# Patient Record
Sex: Male | Born: 1985 | Hispanic: Yes | Marital: Single | Smoking: Never smoker
Health system: Southern US, Community
[De-identification: ages and names within clinical notes are randomized; demographics above are authoritative.]

## PROBLEM LIST (undated history)

## (undated) DIAGNOSIS — T7840XA Allergy, unspecified, initial encounter: Secondary | ICD-10-CM

## (undated) HISTORY — DX: Allergy, unspecified, initial encounter: T78.40XA

---

## 2011-10-09 ENCOUNTER — Ambulatory Visit: Payer: Self-pay | Admitting: Family Medicine

## 2011-10-09 DIAGNOSIS — J4550 Severe persistent asthma, uncomplicated: Secondary | ICD-10-CM | POA: Insufficient documentation

## 2011-10-09 DIAGNOSIS — J45909 Unspecified asthma, uncomplicated: Secondary | ICD-10-CM

## 2011-10-09 MED ORDER — ALBUTEROL SULFATE HFA 108 (90 BASE) MCG/ACT IN AERS: 2.0000 | INHALATION_SPRAY | Freq: Four times a day (QID) | RESPIRATORY_TRACT | Status: DC | PRN

## 2011-10-09 MED ORDER — PREDNISONE 20 MG PO TABS: 40.0000 mg | ORAL_TABLET | Freq: Every day | ORAL | Status: AC

## 2011-10-09 NOTE — Patient Instructions (Signed)
Asma en el adulto (Asthma, Adult) El asma es causada por la contraccin del ancho de los conductos de aire en los pulmones. Puede desencadenarse debido al polen, el polvo, la piel o plumas de animales, el moho, algunos alimentos, las dificultades respiratorias, la exposicin al humo, el ejercicio, el estrs emocional u otros alrgenos (elementos que provocan reacciones alrgicas). La repeticin de los ataques es comn. INSTRUCCIONES PARA EL CUIDADO DOMICILIARIO  Tome los medicamentos recetados que le indic el profesional que lo asiste.   Evite el polen, el polvo, la piel o plumas de animales, el moho, el humo y otras cosas que puedan provocarle un ataque en el hogar o en el trabajo.   Puede que tenga menos ataques si disminuye el polvo en el hogar. Los purificadores de aire electrostticos pueden ayudar.   Puede ser de utilidad cubrir las almohadas o colchones con materiales que no tiendan a provocar alergias.   Converse con el profesional que lo asiste acerca del plan de accin que deber implementar para el manejo en el hogar de los ataques de asma que incluya un medidor de flujo mximo para medir la gravedad del ataque. Un plan de accin que lo ayude a minimizar o detener los ataques sin necesidad de solicitar atencin mdica.   Si no tiene restriccin para consumir lquidos, beba entre 8 y 10 vasos de agua por da.   Siempre tenga preparado un plan par solicitar atencin mdica si es necesario, y que incluya el llamado al mdico, el acceso a un centro de emergencias local y el llamado al 911 para los casos graves.   Converse con el profesional que lo asiste sobre posibles rutinas de ejercicio fsico que pudiera practicar.   Si las plumas o el pelo de un animal son el motivo del asma, deber deshacerse de las mascotas.  SOLICITE ATENCIN MDICA SI:  Respira con dificultad an cuando toma la medicina para prevenir los ataques.   Siente dolores musculares, dolor en el pecho o espesamiento  del esputo.   Su esputo cambia de un color claro o blanco a un color amarillo, verde, gris o sanguinolento.   Tiene trastornos ocasionados por la medicina que est tomando (como urticaria, picazn, hinchazn o dificultades respiratorias).  SOLICITE ATENCIN MDICA DE INMEDIATO SI:  Su medicamento habitual no detiene el resuello o si le aumenta la tos o le falta el aliento.   Tiene una creciente dificultad para respirar.   Tiene fiebre.  EST SEGURO QUE:  Comprende las instrucciones para el alta mdica.   Controlar su enfermedad.   Solicitar atencin mdica de inmediato segn las indicaciones.  Document Released: 08/22/2005 Document Revised: 05/04/2011 [employer] Patient Information 2012 [employer]. 

## 2011-10-09 NOTE — Progress Notes (Signed)
26 yo man from [place] with chronic asthma needing refill on Rx  Very SOB lately.  O:  NAD  Heent clear Chest  Decreased BS with wheezes Color good No resp distress

## 2011-12-16 ENCOUNTER — Ambulatory Visit: Payer: Self-pay | Admitting: Internal Medicine

## 2011-12-16 ENCOUNTER — Encounter: Payer: Self-pay | Admitting: Internal Medicine

## 2011-12-16 VITALS — BP 117/82 | HR 96 | Temp 98.4°F | Resp 16 | Ht 67.75 in | Wt 222.0 lb

## 2011-12-16 DIAGNOSIS — Z789 Other specified health status: Secondary | ICD-10-CM

## 2011-12-16 DIAGNOSIS — R0602 Shortness of breath: Secondary | ICD-10-CM

## 2011-12-16 DIAGNOSIS — R0609 Other forms of dyspnea: Secondary | ICD-10-CM

## 2011-12-16 DIAGNOSIS — T7840XA Allergy, unspecified, initial encounter: Secondary | ICD-10-CM | POA: Insufficient documentation

## 2011-12-16 DIAGNOSIS — R0689 Other abnormalities of breathing: Secondary | ICD-10-CM

## 2011-12-16 DIAGNOSIS — J45909 Unspecified asthma, uncomplicated: Secondary | ICD-10-CM

## 2011-12-16 DIAGNOSIS — E669 Obesity, unspecified: Secondary | ICD-10-CM

## 2011-12-16 LAB — GLUCOSE, POCT (MANUAL RESULT ENTRY): POC Glucose: 94

## 2011-12-16 MED ORDER — BECLOMETHASONE DIPROPIONATE 80 MCG/ACT IN AERS: 2.0000 | INHALATION_SPRAY | Freq: Two times a day (BID) | RESPIRATORY_TRACT | Status: DC

## 2011-12-16 MED ORDER — PREDNISONE 20 MG PO TABS: ORAL_TABLET | ORAL | Status: DC

## 2011-12-16 MED ORDER — CETIRIZINE HCL 10 MG PO TABS: 10.0000 mg | ORAL_TABLET | Freq: Every day | ORAL | Status: DC

## 2011-12-16 MED ORDER — ALBUTEROL SULFATE HFA 108 (90 BASE) MCG/ACT IN AERS: 2.0000 | INHALATION_SPRAY | Freq: Four times a day (QID) | RESPIRATORY_TRACT | Status: DC | PRN

## 2011-12-16 MED ORDER — ALBUTEROL SULFATE (2.5 MG/3ML) 0.083% IN NEBU
2.5000 mg | INHALATION_SOLUTION | Freq: Once | RESPIRATORY_TRACT | Status: AC
  Administered 2011-12-16: 2.5 mg via RESPIRATORY_TRACT

## 2011-12-16 MED ORDER — METHYLPREDNISOLONE ACETATE 80 MG/ML IJ SUSP
120.0000 mg | Freq: Once | INTRAMUSCULAR | Status: AC
  Administered 2011-12-16: 120 mg via INTRAMUSCULAR

## 2011-12-16 NOTE — Patient Instructions (Signed)
Rinitis Alrgica (Allergic Rhinitis) La rinitis alrgica aparece cuando las membranas mucosas de la nariz reaccionan a los alrgenos. Los alrgenos son las partculas que estn en el aire y a las que el organismo responde cuando existe una Automotive engineer. Esto hace que usted libere anticuerpos de Programmer, multimedia. A travs de una sucesin de procesos, finalmente se libera histamina (de ah el uso de antihistamnicos) en el torrente sanguneo. Aunque esto implica una proteccin para su organismo, es lo que le produce las Manuel Garcia., como estornudos frecuentes, congestin, picazn y goteos de Architectural technologist.  CAUSAS Los alergenos del polen pueden provenir del csped, rboles y hierbas. Esto produce la rinitis alrgica estacional, o "fiebre de heno". Otras alrgenos pueden ocasionar rinitis alrgica persistente (rinitis alrgica perenne) como aquellos que contienen los caros del polvo del hogar, el pelaje de las mascotas y las esporas del moho.  SNTOMAS  Congestin nasal.   Picazn y goteo de la nariz con estornudos y lagrimeo de los ojos.   Generalmente, tambin puede haber picazn de la boca, ojos y odos.  Las alergias no pueden curarse pero pueden controlarse con medicamentos. DIAGNSTICO Si no reconoce exactamente cul es el alrgeno que le ocasiona el problema, podrn realizarle pruebas de [place], o de piel para determinarlo. TRATAMIENTO  Evite el alrgeno.   Podrn ser tiles medicamentos y vacunas para la alergia (inmunoterapia).   Con frecuencia la fiebre de heno se trata simplemente con antihistamnicos en forma de pldoras o sprays nasales. Los antihistamnicos bloquean los efectos de la histamina. Existen medicamentos de venta libre que lo ayudarn a Associate Professor, la congestin nasal y la hinchazn alrededor de los ojos. Consulte con el profesional antes de tomar o Civil Service fast streamer.  Si estos medicamentos no le Merchant navy officer, existen muchos otros nuevos que el  profesional que lo asiste puede prescribirle. Si las medidas iniciales no son efectivas, podrn utilizarse medicamentos ms fuertes. Las inyecciones desensibilizantes pueden utilizarse si los otros medicamentos fracasan. La desensibilizacin aparece cuando un paciente recibe inyecciones continuas hasta que el cuerpo se vuelve menos sensible al alrgeno. Asegrese de Education officer, environmental un seguimiento con el profesional que lo asiste si los problemas continan. SOLICITE ANTENCIN MDICA SI:   Le sube la temperatura a ms de 100.5 F (38.1 C).   Presenta tos que no se alivia (persistente).   Le falta el aire.   Comienza a respirar con dificultad.   Los sntomas interfieren con las actividades diarias.  Document Released: 06/01/2005 Document Revised: 08/11/2011 [facility] Patient Information 2012 [place], [place].Prevencin del asma (Asthma Prevention) El humo del cigarrillo, el polvo domstico, los hongos, el polen, la descamacin de los [place], algunos insectos, la actividad fsica y [place] aire frio pueden ser disparadores de un episodio de asma. En algunos casos, la causa no se identifica.  Para disminuir la frecuencia de los ataques, siga los siguientes consejos que podr Education officer, environmental en su casa:  Evite el humo del cigarrillo o el que provenga de otras fuentes. No permita que nadie fume en la casa que habita un enfermo de asma. Si se permite fumar en el interior, deber hacerse en una habitacin en la que se cierre la puerta. Posteriormente, deber abrirse una ventana para ventilar el aire. En lo posible, no utilice un horno a lea, una estufa a querosene o un hogar de lea. Minimice la exposicin a toda fuente de humo, inclusive incienso, velas, fogatas o fuegos artificiales.   Disminuya la exposicin al polvo. Mantenga las ventanas cerradas y [place] aire acondicionado  central durante la temporada de alergia al polen. En lo posible, permanezca dentro de la habitacin con las ventanas cerradas desde las  ltimas horas de la maana hasta la tarde. Evite cortar el csped si el polen le causa alergia. Mdese de ropa y tome una ducha despus de estar en el exterior durante esta poca del ao.   Elimine el moho de los baos y zonas hmedas. Hgalo limpiando los pisos con un fungicida o con lavandina diluida. Evite el uso de humidificadores, vaporizadores o refrigerantes hmedos. Pueden diseminar el moho a travs del aire. Arregle las caeras que pierdan agua u otras fuentes de agua que tengan moho alrededor.   Disminuya la exposicin al polvo de la casa. Hgalo dejando los pisos desocupados, aspirndolos con frecuencia y [employer] filtros de las calderas y los acondicionadores de aire con Psychologist, clinical. Evite el uso de elementos de cama de plumas, lana o espuma de goma. Utilice almohadas de Equities trader y cobertores de [employer] colchones. Lave la ropa de cama semanalmente en agua caliente (ms caliente que 130 F).   Trate de pedirle a otra persona que realice el aspirado una o dos veces por semana. Permanezca fuera de las habitaciones mientras son aspiradas y por algn tiempo despus. Si el aspirado lo realiza usted, use una mascarilla para el polvo (consgala en una ferretera), Neomia Dear bolsa para la aspiradora doble o de [place], o una aspiradora con un filtro HEPA.   Evite los perfumes, el talco, el aerosol para el cabello, las pinturas y otros olores y vapores intensos.   Mantenga a las [address] caliente (gatos, perros, roedores, Engineer, mining) fuera de su casa, si ellos le provocan el asma. Si no puede mantener a las Engineer, mining, Engineer, water fuera de la habitacin y de otras reas en las que duerme, y Dietitian la puerta cerrada. Quite de su casa las alfombras y los muebles cubiertos con telas. Si eso no es posible, [address] las mascotas lejos de los muebles cubiertos de tela y de las alfombras.   Elimine las cucarachas. Mantenga los alimentos y las bebidas en contenedores  cerrados. Nunca deje comida a la vista. Use venenos, tramperas polvos, geles o pastas (por ejemplo cido brico). Si [place] un aerosol para exterminar las cucarachas, permanezca fuera de la habitacin hasta que el olor desaparezca.   Disminuya la humedad del ambiente a menos del 60%. Use un purificador de aire.   Evite los sulfitos en alimentos y bebidas. No beba cerveza ni vino, ni consuma frutas secas, patatas procesadas o langostinos, si estos le producen sntomas de asma.   Evite el aire fro. Cbrase la nariz y la boca con una bufanda en [address] fros o ventosos.   No consuma aspirina. Este es el medicamento que con ms frecuencia causa ataques de asma.   Si la actividad fsica le desencadena un ataque de asma, consulte a su mdico como debe prepararse antes de hacer ejercicio. (Por ejemplo, pregunte si puede usar su inhalador 10 minutos antes de la [place]).   Evite el contacto cercano con personas que estn resfriadas o tienen gripe, ya que los sntomas de asma pueden empeorar si se contagia la infeccin. Lvese las manos cuidadosamente despus de tocar objetos manipulados por otras personas que sufren infecciones respiratorias.   Aplquese la vacuna contra la gripe todos los aos para protegerse contra el virus de la gripe, lo que con frecuencia hace que el asma empeore durante das o semanas. Tambin aplquese la vacuna contra la  neumona una vez cada 5  10 aos.  Comunquese con su mdico si necesita ms informacin acerca de las medidas que puede tomar para prevenir los ataques de asma. Document Released: 08/22/2005 Document Revised: 08/11/2011 [facility] Patient Information 2012 [place], [place].

## 2011-12-16 NOTE — Progress Notes (Signed)
  Subjective:    Patient ID: Nicholas Sanford, male    DOB: 02/04/1986, 26 y.o.   MRN: [id number]  HPI Asthma attack, SOB, allergy attack, has these 2 or 3x per year. Only requested albuterol HFA, needs maintenance steroid inhalers. Has no insurance. No fever, and no sighns of infection. Last visit in Feb. And given oral prednisone and albuterol   Review of Systems     Objective:   Physical Exam PFR 180 and nl is 600 Nasal congestion and rhinorrheaand boggy edematous MMs. Lungs wheezing and tight all over  Nebulizer with albuterol  Results for orders placed in visit on 12/16/11  GLUCOSE, POCT (MANUAL RESULT ENTRY)      Component Value Range   POC Glucose 94         Assessment & Plan:  Asthma and Allergys  Qvar qd Albuterol prn Prednisone 16d taper Cetirizine 10mg  qd  RTC1 week  Post peak flow: 280 Pulse Ox at end of visit: 92%

## 2011-12-22 ENCOUNTER — Ambulatory Visit: Payer: Self-pay | Admitting: Internal Medicine

## 2011-12-22 DIAGNOSIS — J309 Allergic rhinitis, unspecified: Secondary | ICD-10-CM

## 2011-12-22 DIAGNOSIS — Z789 Other specified health status: Secondary | ICD-10-CM

## 2011-12-22 DIAGNOSIS — R0602 Shortness of breath: Secondary | ICD-10-CM

## 2011-12-22 NOTE — Progress Notes (Signed)
  Subjective:    Patient ID: Nicholas Sanford, male    DOB: 02/04/1986, 25 y.o.   MRN: [id number]  HPI Improving, less allergy and less SOB. Has moderate to severe asthma   Review of Systems     Objective:   Physical Exam PFR 350, nl610 Lungs wheezy with decreased BS       Assessment & Plan:  ON prednisone taper 10 more days On Qvar and albuterol F/up 10d

## 2012-09-05 HISTORY — PX: APPENDECTOMY: SHX54

## 2012-09-13 ENCOUNTER — Other Ambulatory Visit: Payer: Self-pay | Admitting: Internal Medicine

## 2012-11-04 ENCOUNTER — Ambulatory Visit: Payer: Self-pay | Admitting: Family Medicine

## 2012-11-04 VITALS — BP 138/86 | HR 71 | Temp 98.9°F | Resp 16 | Ht 68.0 in | Wt 228.0 lb

## 2012-11-04 DIAGNOSIS — E669 Obesity, unspecified: Secondary | ICD-10-CM

## 2012-11-04 DIAGNOSIS — J45901 Unspecified asthma with (acute) exacerbation: Secondary | ICD-10-CM

## 2012-11-04 DIAGNOSIS — J45909 Unspecified asthma, uncomplicated: Secondary | ICD-10-CM

## 2012-11-04 DIAGNOSIS — R062 Wheezing: Secondary | ICD-10-CM

## 2012-11-04 DIAGNOSIS — R0689 Other abnormalities of breathing: Secondary | ICD-10-CM

## 2012-11-04 DIAGNOSIS — Z789 Other specified health status: Secondary | ICD-10-CM

## 2012-11-04 DIAGNOSIS — R0602 Shortness of breath: Secondary | ICD-10-CM

## 2012-11-04 MED ORDER — IPRATROPIUM BROMIDE 0.02 % IN SOLN: 0.5000 mg | Freq: Once | RESPIRATORY_TRACT | Status: DC

## 2012-11-04 MED ORDER — METHYLPREDNISOLONE SODIUM SUCC 125 MG IJ SOLR: 125.0000 mg | Freq: Once | INTRAMUSCULAR | Status: DC

## 2012-11-04 MED ORDER — ALBUTEROL SULFATE HFA 108 (90 BASE) MCG/ACT IN AERS: 2.0000 | INHALATION_SPRAY | Freq: Four times a day (QID) | RESPIRATORY_TRACT | Status: DC | PRN

## 2012-11-04 MED ORDER — PREDNISONE 20 MG PO TABS: ORAL_TABLET | ORAL | Status: DC

## 2012-11-04 MED ORDER — ALBUTEROL SULFATE (2.5 MG/3ML) 0.083% IN NEBU: 2.5000 mg | INHALATION_SOLUTION | Freq: Once | RESPIRATORY_TRACT | Status: DC

## 2012-11-04 MED ORDER — BECLOMETHASONE DIPROPIONATE 80 MCG/ACT IN AERS: 1.0000 | INHALATION_SPRAY | RESPIRATORY_TRACT | Status: DC | PRN

## 2012-11-04 NOTE — Progress Notes (Signed)
[facility]:  Office Visit  Chief Complaint:  Chief Complaint  Patient presents with  . Asthma    HPI: Nicholas Sanford is a 27 y.o. male who complains of  Here for asthma sxs ie wheezes x 3 days, ran out of Qvar 3 days ago. He has allergies as well. His attack got triggered after he was in the car with the window down.  No fevers, chills, URI sxs; + minimal SOB/DOE. Denies CP  Past Medical History  Diagnosis Date  . Allergy   . Asthma    History reviewed. No pertinent past surgical history. History   Social History  . Marital Status: Single    Spouse Name: N/A    Number of Children: N/A  . Years of Education: N/A   Social History Main Topics  . Smoking status: Current Every Day Smoker  . Smokeless tobacco: None  . Alcohol Use: No  . Drug Use: No  . Sexually Active: None   Other Topics Concern  . None   Social History Narrative  . None   History reviewed. No pertinent family history. Allergies  Allergen Reactions  . Penicillins Itching   Prior to Admission medications   Medication Sig Start Date End Date Taking? Authorizing Stoney Karczewski  beclomethasone (QVAR) 80 MCG/ACT inhaler Inhale 2 puffs into the lungs 2 (two) times daily. 12/16/11 12/15/12 Yes Jonita Albee, MD  albuterol (PROVENTIL HFA;VENTOLIN HFA) 108 (90 BASE) MCG/ACT inhaler Inhale 2 puffs into the lungs every 6 (six) hours as needed. 10/09/11   Elvina Sidle, MD  albuterol (PROVENTIL HFA;VENTOLIN HFA) 108 (90 BASE) MCG/ACT inhaler Inhale 2 puffs into the lungs every 6 (six) hours as needed for wheezing. 12/16/11 12/15/12  Jonita Albee, MD  cetirizine (ZYRTEC) 10 MG tablet Take 1 tablet (10 mg total) by mouth daily. 12/16/11 12/15/12  Jonita Albee, MD  predniSONE (DELTASONE) 20 MG tablet 1 tid x5d, 1 bid x5d, 1 qd x5d po pc for breathing 12/16/11   Jonita Albee, MD     ROS: The patient denies fevers, chills, night sweats, unintentional weight loss, chest pain, palpitations, nausea, vomiting,  abdominal pain, dysuria, hematuria, melena, numbness, weakness, or tingling.  All other systems have been reviewed and were otherwise negative with the exception of those mentioned in the HPI and as above.    PHYSICAL EXAM: Filed Vitals:   11/04/12 1036  BP: 138/86  Pulse: 71  Temp: 98.9 F (37.2 C)  Resp: 16  Spo 2     97% Filed Vitals:   11/04/12 1036  Height: 5\' 8"  (1.727 m)  Weight: 228 lb (103.42 kg)   Body mass index is 34.68 kg/(m^2).  General: Alert, no acute distress HEENT:  Normocephalic, atraumatic, oropharynx patent. No exudates, TM nl. No increase WOB Cardiovascular:  Regular rate and rhythm, no rubs murmurs or gallops.  No Carotid bruits, radial pulse intact. No pedal edema.  Respiratory: Clear to auscultation bilaterally.  + diffuse wheezes. No rales, or rhonchi.  No cyanosis, no use of accessory musculature GI: No organomegaly, abdomen is soft and non-tender, positive bowel sounds.  No masses. Skin: No rashes. Neurologic: Facial musculature symmetric. Psychiatric: Patient is appropriate throughout our interaction. Lymphatic: No cervical lymphadenopathy Musculoskeletal: Gait intact.   LABS: Results for orders placed in visit on 12/16/11  GLUCOSE, POCT (MANUAL RESULT ENTRY)      Result Value Range   POC Glucose 94       EKG/XRAY:   Primary read interpreted  by Dr. Conley Rolls at [facility].   ASSESSMENT/PLAN: Encounter Diagnoses  Name Primary?  Marland Kitchen Asthma with acute exacerbation Yes  . Wheeze   . Allergy history unknown   . Asthma   . Respiratory insufficiency   . SOB (shortness of breath)   . Obesity    Pre-peak is 200 post 400 after duonebs. His expected should be in 525-550s.  Feels better after breathing treatment. Decrease wheezing and increase lung expansion Ok not to have IM steroids since self pay I will put him on a longer steroid taper since we are not doing IM steroids, rx  prednisone 20 mg x  3 pills 5 D, 2 pills x 5D, then 1pill x 5D.  Refilled  Qvar, Albuterol Advise patient that he needs to take his zyrtec regular and use his Qvar regular since he is prone to getting asthma exacerbations, the Qvar as maintenance will improve his asthma--- 2 puffs BID F/u in 48 hrs prn or go to ER prn for worsening sxs    LE, THAO PHUONG, DO 11/04/2012 11:46 AM

## 2012-11-04 NOTE — Patient Instructions (Addendum)
Prevencin del asma (Asthma Prevention) El humo del cigarrillo, el polvo domstico, los hongos, el polen, la descamacin de los [place], algunos insectos, la actividad fsica y [place] aire frio pueden ser disparadores de un episodio de asma. En algunos casos, la causa no se identifica.  Para disminuir la frecuencia de los ataques, siga los siguientes consejos que podr Education officer, environmental en su casa:  Evite el humo del cigarrillo o el que provenga de otras fuentes. No permita que nadie fume en la casa que habita un enfermo de asma. Si se permite fumar en el interior, deber hacerse en una habitacin en la que se cierre la puerta. Posteriormente, deber abrirse una ventana para ventilar el aire. En lo posible, no utilice un horno a lea, una estufa a querosene o un hogar de lea. Minimice la exposicin a toda fuente de humo, inclusive incienso, velas, fogatas o fuegos artificiales.  Disminuya la exposicin al polvo. Mantenga las ventanas cerradas y [employer] aire acondicionado central durante la temporada de alergia al polen. En lo posible, permanezca dentro de la habitacin con las ventanas cerradas desde las ltimas horas de la maana hasta la tarde. Evite cortar el csped si el polen le causa alergia. Mdese de ropa y tome una ducha despus de estar en el exterior durante esta poca del ao.  Elimine el moho de los baos y zonas hmedas. Hgalo limpiando los pisos con un fungicida o con lavandina diluida. Evite el uso de humidificadores, vaporizadores o refrigerantes hmedos. Pueden diseminar el moho a travs del aire. Arregle las caeras que pierdan agua u otras fuentes de agua que tengan moho alrededor.  Disminuya la exposicin al polvo de la casa. Hgalo dejando los pisos desocupados, aspirndolos con frecuencia y [employer] filtros de las calderas y los acondicionadores de aire con Psychologist, clinical. Evite el uso de elementos de cama de plumas, lana o espuma de goma. Utilice almohadas de Equities trader y  cobertores de [employer] colchones. Lave la ropa de cama semanalmente en agua caliente (ms caliente que 130 F).  Trate de pedirle a otra persona que realice el aspirado una o dos veces por semana. Permanezca fuera de las habitaciones mientras son aspiradas y por algn tiempo despus. Si el aspirado lo realiza usted, use una mascarilla para el polvo (consgala en una ferretera), Neomia Dear bolsa para la aspiradora doble o de [place], o una aspiradora con un filtro HEPA.  Evite los perfumes, el talco, el aerosol para el cabello, las pinturas y otros olores y vapores intensos.  Mantenga a las [address] caliente (gatos, perros, roedores, Engineer, mining) fuera de su casa, si ellos le provocan el asma. Si no puede mantener a las Engineer, mining, Engineer, water fuera de la habitacin y de otras reas en las que duerme, y Dietitian la puerta cerrada. Quite de su casa las alfombras y los muebles cubiertos con telas. Si eso no es posible, [address] las mascotas lejos de los muebles cubiertos de tela y de las alfombras.  Elimine las cucarachas. Mantenga los alimentos y las bebidas en contenedores cerrados. Nunca deje comida a la vista. Use venenos, tramperas polvos, geles o pastas (por ejemplo cido brico). Si [place] un aerosol para exterminar las cucarachas, permanezca fuera de la habitacin hasta que el olor desaparezca.  Disminuya la humedad del ambiente a menos del 60%. Use un purificador de aire.  Evite los sulfitos en alimentos y bebidas. No beba cerveza ni vino, ni consuma frutas secas, patatas procesadas o langostinos, si estos le producen  sntomas de asma.  Evite el aire fro. Cbrase la nariz y la boca con una bufanda en [address] fros o ventosos.  No consuma aspirina. Este es el medicamento que con ms frecuencia causa ataques de asma.  Si la actividad fsica le desencadena un ataque de asma, consulte a su mdico como debe prepararse antes de hacer ejercicio. (Por ejemplo, pregunte si  puede usar su inhalador 10 minutos antes de la [place]).  Evite el contacto cercano con personas que estn resfriadas o tienen gripe, ya que los sntomas de asma pueden empeorar si se contagia la infeccin. Lvese las manos cuidadosamente despus de tocar objetos manipulados por otras personas que sufren infecciones respiratorias.  Aplquese la vacuna contra la gripe todos los aos para protegerse contra el virus de la gripe, lo que con frecuencia hace que el asma empeore durante das o semanas. Tambin aplquese la vacuna contra la neumona una vez cada 5  10 aos. Comunquese con su mdico si necesita ms informacin acerca de las medidas que puede tomar para prevenir los ataques de asma. Document Released: 08/22/2005 Document Revised: 11/14/2011 [facility] Patient Information 2013 [place], [place].

## 2012-11-07 MED ORDER — BECLOMETHASONE DIPROPIONATE 80 MCG/ACT IN AERS: 2.0000 | INHALATION_SPRAY | Freq: Two times a day (BID) | RESPIRATORY_TRACT | Status: DC

## 2013-01-19 ENCOUNTER — Other Ambulatory Visit: Payer: Self-pay | Admitting: Internal Medicine

## 2013-01-29 ENCOUNTER — Other Ambulatory Visit: Payer: Self-pay | Admitting: Internal Medicine

## 2013-08-11 ENCOUNTER — Encounter (HOSPITAL_COMMUNITY): Payer: Self-pay | Admitting: Emergency Medicine

## 2013-08-11 ENCOUNTER — Emergency Department (HOSPITAL_COMMUNITY): Payer: Medicaid Other

## 2013-08-11 ENCOUNTER — Emergency Department (HOSPITAL_COMMUNITY): Payer: Medicaid Other | Admitting: Anesthesiology

## 2013-08-11 ENCOUNTER — Ambulatory Visit: Payer: Self-pay | Admitting: Physician Assistant

## 2013-08-11 ENCOUNTER — Encounter (HOSPITAL_COMMUNITY)
Admission: EM | Disposition: A | Discharge status: Home / Self Care | Payer: Self-pay | Source: Home / Self Care | Attending: Emergency Medicine

## 2013-08-11 ENCOUNTER — Observation Stay (HOSPITAL_COMMUNITY)
Admission: EM | Admit: 2013-08-11 | Discharge: 2013-08-12 | Disposition: A | Discharge status: Home / Self Care | Payer: Medicaid Other | Attending: Surgery | Admitting: Surgery

## 2013-08-11 ENCOUNTER — Encounter (HOSPITAL_COMMUNITY): Payer: Medicaid Other | Admitting: Anesthesiology

## 2013-08-11 ENCOUNTER — Ambulatory Visit: Payer: Self-pay

## 2013-08-11 VITALS — BP 110/72 | HR 90 | Temp 98.0°F | Resp 20 | Ht 67.5 in | Wt 231.8 lb

## 2013-08-11 DIAGNOSIS — R319 Hematuria, unspecified: Secondary | ICD-10-CM

## 2013-08-11 DIAGNOSIS — J45909 Unspecified asthma, uncomplicated: Secondary | ICD-10-CM | POA: Diagnosis not present

## 2013-08-11 DIAGNOSIS — Z23 Encounter for immunization: Secondary | ICD-10-CM | POA: Insufficient documentation

## 2013-08-11 DIAGNOSIS — Z79899 Other long term (current) drug therapy: Secondary | ICD-10-CM | POA: Insufficient documentation

## 2013-08-11 DIAGNOSIS — K37 Unspecified appendicitis: Secondary | ICD-10-CM | POA: Diagnosis present

## 2013-08-11 DIAGNOSIS — K219 Gastro-esophageal reflux disease without esophagitis: Secondary | ICD-10-CM | POA: Insufficient documentation

## 2013-08-11 DIAGNOSIS — R109 Unspecified abdominal pain: Secondary | ICD-10-CM

## 2013-08-11 DIAGNOSIS — Z560 Unemployment, unspecified: Secondary | ICD-10-CM | POA: Insufficient documentation

## 2013-08-11 DIAGNOSIS — K3580 Unspecified acute appendicitis: Principal | ICD-10-CM | POA: Insufficient documentation

## 2013-08-11 DIAGNOSIS — D72829 Elevated white blood cell count, unspecified: Secondary | ICD-10-CM

## 2013-08-11 HISTORY — PX: LAPAROSCOPIC APPENDECTOMY: SHX408

## 2013-08-11 LAB — POCT URINALYSIS DIPSTICK
Nitrite, UA: NEGATIVE
Urobilinogen, UA: 0.2
pH, UA: 7

## 2013-08-11 LAB — POCT UA - MICROSCOPIC ONLY
Casts, Ur, LPF, POC: NEGATIVE
Epithelial cells, urine per micros: NEGATIVE
Mucus, UA: POSITIVE
Yeast, UA: NEGATIVE

## 2013-08-11 LAB — POCT CBC
Granulocyte percent: 88.9 %G — AB (ref 37–80)
HCT, POC: 49.2 % (ref 43.5–53.7)
Hemoglobin: 16 g/dL (ref 14.1–18.1)
Lymph, poc: 1.8 (ref 0.6–3.4)
MCH, POC: 28.5 pg (ref 27–31.2)
MCHC: 32.5 g/dL (ref 31.8–35.4)
MID (cbc): 1.4 — AB (ref 0–0.9)
MPV: 9.5 fL (ref 0–99.8)
POC LYMPH PERCENT: 6.3 %L — AB (ref 10–50)
POC MID %: 4.8 %M (ref 0–12)
RBC: 5.61 M/uL (ref 4.69–6.13)
RDW, POC: 13.6 %
WBC: 28.3 10*3/uL — AB (ref 4.6–10.2)

## 2013-08-11 LAB — BASIC METABOLIC PANEL
BUN: 13 mg/dL (ref 6–23)
Calcium: 9.3 mg/dL (ref 8.4–10.5)
Chloride: 93 mEq/L — ABNORMAL LOW (ref 96–112)
Creatinine, Ser: 0.77 mg/dL (ref 0.50–1.35)
GFR calc Af Amer: >90 mL/min (ref >90)
GFR calc non Af Amer: >90 mL/min (ref >90)
Glucose, Bld: 136 mg/dL — ABNORMAL HIGH (ref 70–99)

## 2013-08-11 SURGERY — APPENDECTOMY, LAPAROSCOPIC
Anesthesia: General | Site: Abdomen

## 2013-08-11 MED ORDER — SODIUM CHLORIDE 0.9 % IV BOLUS (SEPSIS)
1000.0000 mL | Freq: Once | INTRAVENOUS | Status: AC
  Administered 2013-08-11: 1000 mL via INTRAVENOUS

## 2013-08-11 MED ORDER — PREDNISONE 10 MG PO TABS
10.0000 mg | ORAL_TABLET | Freq: Every day | ORAL | Status: DC
  Administered 2013-08-12: 10 mg via ORAL
  Filled 2013-08-11 (×2): qty 1

## 2013-08-11 MED ORDER — IOHEXOL 300 MG/ML  SOLN
50.0000 mL | Freq: Once | INTRAMUSCULAR | Status: AC | PRN
  Administered 2013-08-11: 50 mL via ORAL

## 2013-08-11 MED ORDER — HYDROCODONE-ACETAMINOPHEN 5-325 MG PO TABS: 1.0000 | ORAL_TABLET | ORAL | Status: DC | PRN

## 2013-08-11 MED ORDER — LIDOCAINE HCL (CARDIAC) 20 MG/ML IV SOLN
INTRAVENOUS | Status: DC | PRN
  Administered 2013-08-11: 100 mg via INTRAVENOUS

## 2013-08-11 MED ORDER — SUCCINYLCHOLINE CHLORIDE 20 MG/ML IJ SOLN
INTRAMUSCULAR | Status: AC
  Filled 2013-08-11: qty 1

## 2013-08-11 MED ORDER — MORPHINE SULFATE 4 MG/ML IJ SOLN
4.0000 mg | Freq: Once | INTRAMUSCULAR | Status: AC
  Administered 2013-08-11: 4 mg via INTRAVENOUS
  Filled 2013-08-11: qty 1

## 2013-08-11 MED ORDER — SUCCINYLCHOLINE CHLORIDE 20 MG/ML IJ SOLN
INTRAMUSCULAR | Status: DC | PRN
  Administered 2013-08-11: 100 mg via INTRAVENOUS

## 2013-08-11 MED ORDER — IBUPROFEN 600 MG PO TABS
600.0000 mg | ORAL_TABLET | Freq: Four times a day (QID) | ORAL | Status: DC | PRN
  Filled 2013-08-11: qty 1

## 2013-08-11 MED ORDER — ONDANSETRON HCL 4 MG/2ML IJ SOLN: 4.0000 mg | Freq: Four times a day (QID) | INTRAMUSCULAR | Status: DC | PRN

## 2013-08-11 MED ORDER — HEPARIN SODIUM (PORCINE) 5000 UNIT/ML IJ SOLN
5000.0000 [IU] | Freq: Three times a day (TID) | INTRAMUSCULAR | Status: DC
  Administered 2013-08-12: 5000 [IU] via SUBCUTANEOUS
  Filled 2013-08-11 (×4): qty 1

## 2013-08-11 MED ORDER — ALBUTEROL SULFATE HFA 108 (90 BASE) MCG/ACT IN AERS
2.0000 | INHALATION_SPRAY | Freq: Four times a day (QID) | RESPIRATORY_TRACT | Status: DC | PRN
  Filled 2013-08-11: qty 6.7

## 2013-08-11 MED ORDER — ONDANSETRON HCL 4 MG/2ML IJ SOLN
INTRAMUSCULAR | Status: DC | PRN
  Administered 2013-08-11: 4 mg via INTRAVENOUS

## 2013-08-11 MED ORDER — PROPOFOL 10 MG/ML IV BOLUS
INTRAVENOUS | Status: AC
  Filled 2013-08-11: qty 20

## 2013-08-11 MED ORDER — LORATADINE 10 MG PO TABS: 10.0000 mg | ORAL_TABLET | Freq: Every day | ORAL | Status: DC | PRN

## 2013-08-11 MED ORDER — FLUTICASONE PROPIONATE HFA 44 MCG/ACT IN AERO
1.0000 | INHALATION_SPRAY | Freq: Two times a day (BID) | RESPIRATORY_TRACT | Status: DC
  Administered 2013-08-11 – 2013-08-12 (×2): 1 via RESPIRATORY_TRACT
  Filled 2013-08-11: qty 10.6

## 2013-08-11 MED ORDER — CISATRACURIUM BESYLATE (PF) 10 MG/5ML IV SOLN
INTRAVENOUS | Status: DC | PRN
  Administered 2013-08-11: 6 mg via INTRAVENOUS

## 2013-08-11 MED ORDER — IOHEXOL 300 MG/ML  SOLN
100.0000 mL | Freq: Once | INTRAMUSCULAR | Status: AC | PRN
  Administered 2013-08-11: 100 mL via INTRAVENOUS

## 2013-08-11 MED ORDER — HYDROMORPHONE HCL PF 1 MG/ML IJ SOLN: 0.2500 mg | INTRAMUSCULAR | Status: DC | PRN

## 2013-08-11 MED ORDER — BUPIVACAINE-EPINEPHRINE 0.25% -1:200000 IJ SOLN
INTRAMUSCULAR | Status: AC
  Filled 2013-08-11: qty 1

## 2013-08-11 MED ORDER — ONDANSETRON HCL 4 MG PO TABS: 4.0000 mg | ORAL_TABLET | Freq: Four times a day (QID) | ORAL | Status: DC | PRN

## 2013-08-11 MED ORDER — INFLUENZA VAC SPLIT QUAD 0.5 ML IM SUSP
0.5000 mL | INTRAMUSCULAR | Status: AC
  Administered 2013-08-12: 0.5 mL via INTRAMUSCULAR
  Filled 2013-08-11 (×2): qty 0.5

## 2013-08-11 MED ORDER — DEXAMETHASONE SODIUM PHOSPHATE 10 MG/ML IJ SOLN
INTRAMUSCULAR | Status: DC | PRN
  Administered 2013-08-11: 10 mg via INTRAVENOUS

## 2013-08-11 MED ORDER — CISATRACURIUM BESYLATE 20 MG/10ML IV SOLN
INTRAVENOUS | Status: AC
  Filled 2013-08-11: qty 10

## 2013-08-11 MED ORDER — BUPIVACAINE-EPINEPHRINE 0.25% -1:200000 IJ SOLN
INTRAMUSCULAR | Status: DC | PRN
  Administered 2013-08-11: 30 mL

## 2013-08-11 MED ORDER — KCL IN DEXTROSE-NACL 20-5-0.45 MEQ/L-%-% IV SOLN
INTRAVENOUS | Status: AC
  Filled 2013-08-11: qty 1000

## 2013-08-11 MED ORDER — ONDANSETRON HCL 4 MG/2ML IJ SOLN
4.0000 mg | Freq: Once | INTRAMUSCULAR | Status: AC
  Administered 2013-08-11: 4 mg via INTRAVENOUS
  Filled 2013-08-11: qty 2

## 2013-08-11 MED ORDER — GI COCKTAIL ~~LOC~~
30.0000 mL | Freq: Once | ORAL | Status: AC
  Administered 2013-08-11: 30 mL via ORAL

## 2013-08-11 MED ORDER — FENTANYL CITRATE 0.05 MG/ML IJ SOLN
INTRAMUSCULAR | Status: AC
  Filled 2013-08-11: qty 5

## 2013-08-11 MED ORDER — SODIUM CHLORIDE 0.9 % IV SOLN
1.0000 g | INTRAVENOUS | Status: DC
  Administered 2013-08-12: 1 g via INTRAVENOUS
  Filled 2013-08-11: qty 1

## 2013-08-11 MED ORDER — DEXAMETHASONE SODIUM PHOSPHATE 10 MG/ML IJ SOLN
INTRAMUSCULAR | Status: AC
  Filled 2013-08-11: qty 1

## 2013-08-11 MED ORDER — GLYCOPYRROLATE 0.2 MG/ML IJ SOLN
INTRAMUSCULAR | Status: AC
  Filled 2013-08-11: qty 3

## 2013-08-11 MED ORDER — GLYCOPYRROLATE 0.2 MG/ML IJ SOLN
INTRAMUSCULAR | Status: DC | PRN
  Administered 2013-08-11: .6 mg via INTRAVENOUS

## 2013-08-11 MED ORDER — LIDOCAINE HCL (CARDIAC) 20 MG/ML IV SOLN
INTRAVENOUS | Status: AC
  Filled 2013-08-11: qty 5

## 2013-08-11 MED ORDER — KCL IN DEXTROSE-NACL 20-5-0.45 MEQ/L-%-% IV SOLN
INTRAVENOUS | Status: DC
  Administered 2013-08-11 – 2013-08-12 (×2): via INTRAVENOUS
  Filled 2013-08-11 (×4): qty 1000

## 2013-08-11 MED ORDER — FENTANYL CITRATE 0.05 MG/ML IJ SOLN
INTRAMUSCULAR | Status: DC | PRN
  Administered 2013-08-11 (×3): 50 ug via INTRAVENOUS
  Administered 2013-08-11: 100 ug via INTRAVENOUS

## 2013-08-11 MED ORDER — MORPHINE SULFATE 2 MG/ML IJ SOLN: 1.0000 mg | INTRAMUSCULAR | Status: DC | PRN

## 2013-08-11 MED ORDER — PROPOFOL 10 MG/ML IV BOLUS
INTRAVENOUS | Status: DC | PRN
  Administered 2013-08-11: 200 mg via INTRAVENOUS

## 2013-08-11 MED ORDER — SODIUM CHLORIDE 0.9 % IV SOLN
1.0000 g | Freq: Once | INTRAVENOUS | Status: AC
  Administered 2013-08-11: 1 g via INTRAVENOUS
  Filled 2013-08-11 (×2): qty 1

## 2013-08-11 MED ORDER — NEOSTIGMINE METHYLSULFATE 1 MG/ML IJ SOLN
INTRAMUSCULAR | Status: DC | PRN
  Administered 2013-08-11: 4 mg via INTRAVENOUS

## 2013-08-11 MED ORDER — LACTATED RINGERS IV SOLN
INTRAVENOUS | Status: DC
  Administered 2013-08-11 (×2): via INTRAVENOUS

## 2013-08-11 MED ORDER — ONDANSETRON HCL 4 MG/2ML IJ SOLN
INTRAMUSCULAR | Status: AC
  Filled 2013-08-11: qty 2

## 2013-08-11 SURGICAL SUPPLY — 36 items
APPLIER CLIP ROT 10 11.4 M/L (STAPLE) ×2
BENZOIN TINCTURE PRP APPL 2/3 (GAUZE/BANDAGES/DRESSINGS) ×2 IMPLANT
CANISTER SUCTION 2500CC (MISCELLANEOUS) ×2 IMPLANT
CLIP APPLIE ROT 10 11.4 M/L (STAPLE) ×1 IMPLANT
CUTTER FLEX LINEAR 45M (STAPLE) IMPLANT
DECANTER SPIKE VIAL GLASS SM (MISCELLANEOUS) ×2 IMPLANT
DERMABOND ADVANCED (GAUZE/BANDAGES/DRESSINGS) ×1
DERMABOND ADVANCED .7 DNX12 (GAUZE/BANDAGES/DRESSINGS) ×1 IMPLANT
DRAPE LAPAROSCOPIC ABDOMINAL (DRAPES) ×2 IMPLANT
ELECT REM PT RETURN 9FT ADLT (ELECTROSURGICAL) ×2
ELECTRODE REM PT RTRN 9FT ADLT (ELECTROSURGICAL) ×1 IMPLANT
ENDOLOOP SUT PDS II  0 18 (SUTURE)
ENDOLOOP SUT PDS II 0 18 (SUTURE) IMPLANT
GLOVE BIOGEL PI IND STRL 7.0 (GLOVE) ×1 IMPLANT
GLOVE BIOGEL PI INDICATOR 7.0 (GLOVE) ×1
GLOVE SURG SIGNA 7.5 PF LTX (GLOVE) ×2 IMPLANT
GOWN PREVENTION PLUS LG XLONG (DISPOSABLE) ×2 IMPLANT
GOWN STRL REIN XL XLG (GOWN DISPOSABLE) ×4 IMPLANT
KIT BASIN OR (CUSTOM PROCEDURE TRAY) ×2 IMPLANT
PENCIL BUTTON HOLSTER BLD 10FT (ELECTRODE) IMPLANT
POUCH SPECIMEN RETRIEVAL 10MM (ENDOMECHANICALS) ×2 IMPLANT
RELOAD 45 VASCULAR/THIN (ENDOMECHANICALS) IMPLANT
RELOAD STAPLE TA45 3.5 REG BLU (ENDOMECHANICALS) ×2 IMPLANT
SCALPEL HARMONIC ACE (MISCELLANEOUS) ×2 IMPLANT
SET IRRIG TUBING LAPAROSCOPIC (IRRIGATION / IRRIGATOR) ×2 IMPLANT
SOLUTION ANTI FOG 6CC (MISCELLANEOUS) ×2 IMPLANT
STRIP CLOSURE SKIN 1/2X4 (GAUZE/BANDAGES/DRESSINGS) ×2 IMPLANT
SUT MON AB 5-0 PS2 18 (SUTURE) ×2 IMPLANT
TOWEL OR 17X26 10 PK STRL BLUE (TOWEL DISPOSABLE) ×2 IMPLANT
TOWEL OR NON WOVEN STRL DISP B (DISPOSABLE) ×2 IMPLANT
TRAY FOLEY CATH 14FRSI W/METER (CATHETERS) ×2 IMPLANT
TRAY LAP CHOLE (CUSTOM PROCEDURE TRAY) ×2 IMPLANT
TROCAR BLADELESS OPT 5 100 (ENDOMECHANICALS) ×2 IMPLANT
TROCAR XCEL BLUNT TIP 100MML (ENDOMECHANICALS) ×2 IMPLANT
TROCAR XCEL NON-BLD 11X100MML (ENDOMECHANICALS) ×2 IMPLANT
TUBING INSUFFLATION 10FT LAP (TUBING) ×2 IMPLANT

## 2013-08-11 NOTE — Progress Notes (Signed)
Subjective:    Patient ID: Nicholas Sanford, male    DOB: 1986-02-04, 27 y.o.   MRN: [id number]  PCP: No PCP Per Patient  Chief Complaint  Patient presents with  . Abdominal Pain    X yesterday   Medications, allergies, past medical history, surgical history, family history, social history and problem list reviewed and updated.  HPI  Friday (the day before yesterday) ate rice, beans and pizza.  Nothing unusual for him.  No EtOH.  No tobacco.  No drugs.  Awoke yesterday morning with mild pain in the epigastric area.  Didn't eat anything all day, and stayed in bed.  Pain progressively worsened, and vomited x 3 (between 12 midnight and 2 am this morning).  Had a small, but otherwise normal BM this morning. Denies blood or mucous in the stool and emesis. No fever, chills.  No myalgias.  No urinary symptoms.  Review of Systems As above.    Objective:   Physical Exam Blood pressure 110/72, pulse 90, temperature 98 F (36.7 C), temperature source Oral, resp. rate 20, height 5' 7.5" (1.715 m), weight 231 lb 12.8 oz (105.144 kg), SpO2 100.00%. Body mass index is 35.75 kg/(m^2). Well-developed, well nourished [name] who is awake, alert and oriented, in moderate distress.  He is groaning and writhing mildly on the exam table. However, he is also stretched out, with arms above the head and legs extended and crossed at the ankles. HEENT: [place]/AT, sclera and conjunctiva are clear.   Neck: supple, non-tender, no lymphadenopathy, thyromegaly. Heart: RRR, no murmur Lungs: normal effort, CTA Abdomen: mildly hyper-active bowel sounds, supple, no mass or organomegaly. Tender in the epigastrum, RUQ and RLQ with guarding.  No referred tenderness. Extremities: no cyanosis, clubbing or edema. Skin: warm and dry without rash.  Results for orders placed in visit on 08/11/13  POCT CBC      Result Value Range   WBC 28.3 (*) 4.6 - 10.2 K/uL   Lymph, poc 1.8  0.6 - 3.4   POC LYMPH PERCENT 6.3 (*) 10  - 50 %L   MID (cbc) 1.4 (*) 0 - 0.9   POC MID % 4.8  0 - 12 %M   POC Granulocyte 25.2 (*) 2 - 6.9   Granulocyte percent 88.9 (*) 37 - 80 %G   RBC 5.61  4.69 - 6.13 M/uL   Hemoglobin 16.0  14.1 - 18.1 g/dL   HCT, POC 40.9  81.1 - 53.7 %   MCV 87.7  80 - 97 fL   MCH, POC 28.5  27 - 31.2 pg   MCHC 32.5  31.8 - 35.4 g/dL   RDW, POC 91.4     Platelet Count, POC 228  142 - 424 K/uL   MPV 9.5  0 - 99.8 fL  POCT UA - MICROSCOPIC ONLY      Result Value Range   WBC, Ur, HPF, POC 3-4     RBC, urine, microscopic tntc     Bacteria, U Microscopic 2+     Mucus, UA pos     Epithelial cells, urine per micros neg     Crystals, Ur, HPF, POC neg     Casts, Ur, LPF, POC neg     Yeast, UA neg    POCT URINALYSIS DIPSTICK      Result Value Range   Color, UA orange     Clarity, UA clear     Glucose, UA neg     Bilirubin, UA neg  Ketones, UA 40     Spec Grav, UA 1.020     Blood, UA large     pH, UA 7.0     Protein, UA 100     Urobilinogen, UA 0.2     Nitrite, UA neg     Leukocytes, UA Negative     Acute Abdominal Series: [facility] reading (PRIMARY) by  Dr. Milus Glazier.  Chest film is unremarkable.  2 non-specific air-fluid levels on the RIGHT.  Empty LEFT colon.  No nephrolithiasis noted.     Assessment & Plan:  1. Abdominal pain Acute - POCT CBC - POCT UA - Microscopic Only - POCT urinalysis dipstick - DG Abd Acute W/Chest - gi cocktail (Maalox,Lidocaine,Donnatal); Take 30 mLs by mouth once (patient report some improvement in pain with this)  2. Hematuria TNTC red cells with urinary symptoms.  No nephrolithiasis on plain films  3. Leucocytosis Unclear etiology.  IV placed.  Transport to the ED for additional evaluation and treatment. Transferred to EMS at 9:30 am. Discussed with Dr. Milus Glazier.  Fernande Bras, PA-C Physician Assistant-Certified [facility]

## 2013-08-11 NOTE — Anesthesia Preprocedure Evaluation (Signed)
Anesthesia Evaluation  Patient identified by MRN, date of birth, ID band Patient awake    Reviewed: Allergy & Precautions, H&P , NPO status , Patient's Chart, lab work & pertinent test results  Airway Mallampati: II TM Distance: >3 FB Neck ROM: full    Dental no notable dental hx.    Pulmonary asthma ,  breath sounds clear to auscultation  Pulmonary exam normal       Cardiovascular Exercise Tolerance: Good negative cardio ROS  Rhythm:regular Rate:Normal     Neuro/Psych negative neurological ROS  negative psych ROS   GI/Hepatic negative GI ROS, Neg liver ROS,   Endo/Other  negative endocrine ROS  Renal/GU negative Renal ROS  negative genitourinary   Musculoskeletal   Abdominal   Peds  Hematology negative hematology ROS (+)   Anesthesia Other Findings   Reproductive/Obstetrics negative OB ROS                           Anesthesia Physical Anesthesia Plan  ASA: II  Anesthesia Plan: General   Post-op Pain Management:    Induction: Intravenous, Rapid sequence and Cricoid pressure planned  Airway Management Planned: Oral ETT  Additional Equipment:   Intra-op Plan:   Post-operative Plan: Extubation in OR  Informed Consent: I have reviewed the patients History and Physical, chart, labs and discussed the procedure including the risks, benefits and alternatives for the proposed anesthesia with the patient or authorized representative who has indicated his/her understanding and acceptance.   Dental Advisory Given  Plan Discussed with: CRNA and Surgeon  Anesthesia Plan Comments:         Anesthesia Quick Evaluation

## 2013-08-11 NOTE — ED Notes (Signed)
Per [facility], [place], fever, chills, and right sided abdominal pain. Onset 12/6. Pt went to PCP today, reports vomiting x3 since last night, last occurrence approximately 0200. WBC's 28k with hematuria per PCP at [facility] family care. Pt was wheezing, does have hx of asthma. Pt had albuterol neb with EMS. IV 20g LAC with NS. Pt also had GI cocktail at [facility] and PCP indicates that this information will be in EPIC shortly. BP 122/82, HR92, Sat 98% RA, Temp UTO d/t equipment issues. Pt currently denies nausea.

## 2013-08-11 NOTE — Anesthesia Postprocedure Evaluation (Signed)
  Anesthesia Post-op Note  Patient: Nicholas Sanford  Procedure(s) Performed: Procedure(s) (LRB): APPENDECTOMY LAPAROSCOPIC (N/A)  Patient Location: PACU  Anesthesia Type: General  Level of Consciousness: awake and alert   Airway and Oxygen Therapy: Patient Spontanous Breathing  Post-op Pain: mild  Post-op Assessment: Post-op Vital signs reviewed, Patient's Cardiovascular Status Stable, Respiratory Function Stable, Patent Airway and No signs of Nausea or vomiting  Last Vitals:  Filed Vitals:   08/11/13 1714  BP: 121/72  Pulse: 84  Temp: 36.7 C  Resp: 16    Post-op Vital Signs: stable   Complications: No apparent anesthesia complications

## 2013-08-11 NOTE — H&P (Signed)
Re:   Nicholas Sanford DOB:   01/05/1986 MRN:   [id number]  [facility] Admission  ASSESSMENT AND PLAN: 1.  Appendicitis  I discussed with the patient the indications and risks of appendiceal surgery.  The primary risks of appendiceal surgery include, but are not limited to, bleeding, infection, bowel surgery, and open surgery.  There is also the risk that the patient may have continued symptoms after surgery.  However, the likelihood of improvement in symptoms and return to the patient's normal status is good. We discussed the typical post-operative recovery course. I tried to answer the patient's questions.  (used translator - John - from ER.  The patient appears to have intermediate [place] skills)  2.  Asthma  Does not have a reg MD.  He goes to Urgent care places to get his inhaler refilled.  Chief Complaint  Patient presents with  . Nausea  . Emesis   REFERRING PHYSICIAN: No PCP Per Patient  HISTORY OF PRESENT ILLNESS: Nicholas Sanford is a 27 y.o. (DOB: 1985-12-07)  hispanic  male whose primary care physician is No PCP Per Patient and comes to the [facility] ER with increasing abdominal pain. His brother, [name], and brother in law, [name], are in the ER with him.  He has no prior GI history.  He developed abdominal pain yesterday AM which worsened as the day progressed.   He didn't eat anything all day, and stayed in bed.  He vomited early this AM.   He came to the [facility] today because of continued abdominal pain.   CT scan - Acute appendicitis - 08/11/2013  Past Medical History  Diagnosis Date  . Allergy   . Asthma      History reviewed. No pertinent past surgical history.    No current facility-administered medications for this encounter.   Current Outpatient Prescriptions  Medication Sig Dispense Refill  . albuterol (PROVENTIL HFA;VENTOLIN HFA) 108 (90 BASE) MCG/ACT inhaler Inhale 2 puffs into the lungs every 6 (six) hours as needed for wheezing or shortness of breath.      . beclomethasone  (QVAR) 80 MCG/ACT inhaler Inhale into the lungs 2 (two) times daily.      . predniSONE (DELTASONE) 10 MG tablet Take 10 mg by mouth daily with breakfast.      . cetirizine (ZYRTEC) 10 MG tablet Take 1 tablet (10 mg total) by mouth daily.  90 tablet  3      Allergies  Allergen Reactions  . Penicillins Itching    REVIEW OF SYSTEMS: Skin:  No history of infection. Infection: No history of MRSA. Neurologic:  No history of stroke.  No history of seizure.  Cardiac:  No history of hypertension. No history of heart disease.  Pulmonary:  Asthma.  [facility]/04/2012.  Endocrine:  No diabetes. No thyroid disease. Gastrointestinal:  No history of stomach disease.  No history of liver disease.  No history of gall bladder disease.  No history of pancreas disease.  No history of colon disease. Urologic:  No history of kidney stones.  No history of bladder infections. Musculoskeletal:  No history of joint or back disease. Hematologic:   Not anticoagulated. Psycho-social:  The patient is oriented.    SOCIAL and FAMILY HISTORY: Not married. He is originally from [place] His brother, [name], and brother in law, [name], are in the ER with him. 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  PHYSICAL EXAM: BP 120/77  Pulse 86  Resp 11  SpO2 91%  General: WN large Hispanic M who is alert and generally healthy appearing.  HEENT: Normal. Pupils equal. Neck: Supple. No mass.  No thyroid mass. Lymph Nodes:  No supraclavicular or cervical nodes. Lungs: Clear to auscultation and symmetric breath sounds. Heart:  RRR. No murmur or rub. Abdomen: Soft.  Normal bowel sounds.  No abdominal scars.  Tender RLQ with mild guarding. Rectal: Not done. Extremities:  Good strength and ROM  in upper and lower extremities. Neurologic:  Grossly intact to motor and sensory function. Psychiatric: Has normal mood and affect. Behavior is normal.   DATA REVIEWED: WBC - 28,300 - 08/11/2013 K  - 3.2 - 08/11/2013 Epic notes  Ovidio Kin, MD,  [facility] [address] [place]  [address]   [place], [place]    [address] Phone:  [phone] FAX:  [phone]

## 2013-08-11 NOTE — ED Provider Notes (Signed)
CSN: [id number]     Arrival date & time 08/11/13  6295 History   First MD Initiated Contact with Patient 08/11/13 0957     Chief Complaint  Patient presents with  . Nausea  . Emesis   (Consider location/radiation/quality/duration/timing/severity/associated sxs/prior Treatment) HPI Comments: Patient presents with abdominal pain. He states he started feeling bad yesterday with some mild pain in his midabdomen. He states the pain got progressively worse tonight and now is located in his right lower abdomen. He's had some associated nausea and vomiting. He denies any fevers or chills. He was seen at [facility] urgent care where he had a CBC which showed an elevated white count of 28,000. He's also noted to have a large amount of blood in his urine. He was sent over here for further evaluation. He states the pain is located in his abdomen did not radiate to his back. He denies any testicular pain. He denies any urinary symptoms. He denies any history of abdominal surgeries. He does have a history of asthma and throughout today's had some increased wheezing. He was given nebulizer treatment by EMS. He denies any significant cough or chest congestion. He denies any runny nose or other URI symptoms.  Patient is a 27 y.o. male presenting with vomiting.  Emesis Associated symptoms: abdominal pain   Associated symptoms: no arthralgias, no chills, no diarrhea and no headaches     Past Medical History  Diagnosis Date  . Allergy   . Asthma    History reviewed. No pertinent past surgical history. History reviewed. No pertinent family history. History  Substance Use Topics  . Smoking status: Never Smoker   . Smokeless tobacco: Never Used  . Alcohol Use: No    Review of Systems  Constitutional: Negative for fever, chills, diaphoresis and fatigue.  HENT: Negative for congestion, rhinorrhea and sneezing.   Eyes: Negative.   Respiratory: Positive for shortness of breath and wheezing. Negative for cough  and chest tightness.   Cardiovascular: Negative for chest pain and leg swelling.  Gastrointestinal: Positive for nausea, vomiting and abdominal pain. Negative for diarrhea and blood in stool.  Genitourinary: Negative for frequency, hematuria, flank pain and difficulty urinating.  Musculoskeletal: Negative for arthralgias and back pain.  Skin: Negative for rash.  Neurological: Negative for dizziness, speech difficulty, weakness, numbness and headaches.    Allergies  Penicillins  Home Medications   Current Outpatient Rx  Name  Route  Sig  Dispense  Refill  . albuterol (PROVENTIL HFA;VENTOLIN HFA) 108 (90 BASE) MCG/ACT inhaler   Inhalation   Inhale 2 puffs into the lungs every 6 (six) hours as needed for wheezing or shortness of breath.         . beclomethasone (QVAR) 80 MCG/ACT inhaler   Inhalation   Inhale into the lungs 2 (two) times daily.         . predniSONE (DELTASONE) 10 MG tablet   Oral   Take 10 mg by mouth daily with breakfast.         . EXPIRED: cetirizine (ZYRTEC) 10 MG tablet   Oral   Take 1 tablet (10 mg total) by mouth daily.   90 tablet   3    BP 122/82  Pulse 92  Resp 14  SpO2 98% Physical Exam  Constitutional: He is oriented to person, place, and time. He appears well-developed and well-nourished.  HENT:  Head: Normocephalic and atraumatic.  Mouth/Throat: Oropharynx is clear and moist.  Eyes: Pupils are equal, round, and reactive to  light.  Neck: Normal range of motion. Neck supple.  Cardiovascular: Normal rate, regular rhythm and normal heart sounds.   Pulmonary/Chest: Effort normal. No respiratory distress. He has wheezes (few scattered expiratory wheezes. No increased work of breathing). He has no rales. He exhibits no tenderness.  Abdominal: Soft. Bowel sounds are normal. There is tenderness (Marked tenderness to his right lower quadrant. There is positive voluntary guarding. No other peritoneal signs). There is no rebound and no guarding.   Musculoskeletal: Normal range of motion. He exhibits no edema.  Lymphadenopathy:    He has no cervical adenopathy.  Neurological: He is alert and oriented to person, place, and time.  Skin: Skin is warm and dry. No rash noted.  Psychiatric: He has a normal mood and affect.    ED Course  Procedures (including critical care time) Labs Review Labs Reviewed  BASIC METABOLIC PANEL - Abnormal; Notable for the following:    Sodium 131 (*)    Potassium 3.2 (*)    Chloride 93 (*)    Glucose, Bld 136 (*)    All other components within normal limits   Imaging Review Ct Abdomen Pelvis W Contrast  08/11/2013   CLINICAL DATA:  Right lower quadrant pain  EXAM: CT ABDOMEN AND PELVIS WITH CONTRAST  TECHNIQUE: Multidetector CT imaging of the abdomen and pelvis was performed using the standard protocol following bolus administration of intravenous contrast.  CONTRAST:  50mL OMNIPAQUE IOHEXOL 300 MG/ML SOLN, OMNIPAQUE IOHEXOL 300 MG/ML SOLN  COMPARISON:  None.  FINDINGS: Small amount of oral contrast material in distal esophagus probable from gastroesophageal reflux.  Lung bases are unremarkable. Sagittal images of the spine are unremarkable.  Liver, spleen, pancreas and adrenals are unremarkable. No aortic aneurysm. No calcified gallstones are noted within gallbladder. Kidneys are symmetrical in size and enhancement. No hydronephrosis or hydroureter.  There is abnormal enlargement and enhancement of the appendix appendix measures 1.3 cm in diameter in axial image 52. There is stranding of surrounding fat as trace periappendiceal fluid. Findings are consistent with acute appendicitis.  The urinary bladder is unremarkable. Prostate gland and seminal vesicles are unremarkable. No distal colonic obstruction. No inguinal adenopathy.  IMPRESSION: 1. There is abnormal enlargement of the appendix with stranding of surrounding fat and small amount of periappendiceal fluid. Findings are consistent with acute  appendicitis. 2. No small bowel obstruction. 3. No hydronephrosis or hydroureter. Critical Value/emergent results were called by telephone at the time of interpretation on 08/11/2013 at 11:48 AM to Dr.Tesneem Dufrane , who verbally acknowledged these results.   Electronically Signed   By: Natasha Mead M.D.   On: 08/11/2013 11:48   Dg Abd Acute W/chest  08/11/2013   CLINICAL DATA:  Abdominal pain  EXAM: ACUTE ABDOMEN SERIES (ABDOMEN 2 VIEW & CHEST 1 VIEW)  COMPARISON:  None.  FINDINGS: There is no evidence of dilated bowel loops or free intraperitoneal air. No radiopaque calculi or other significant radiographic abnormality is seen. Heart size and mediastinal contours are within normal limits. Both lungs are clear.  IMPRESSION: Negative abdominal radiographs.  No acute cardiopulmonary disease.   Electronically Signed   By: Natasha Mead M.D.   On: 08/11/2013 10:01    EKG Interpretation   None       MDM   1. Appendicitis    Patient presents with right lower quadrant pain and elevated white count. He does in fact have appendicitis on CT. There is no evidence of perforation. His asthma is currently controlled after nebulizer  treatment. His oxygen saturations are 94% on manner. He has some mild wheezing but no increased work of breathing. I did consult with Dr. Ezzard Standing with general surgery who will come and see the patient. He's pet allergic so it was decided to give the patient a Invanz. He was also given IV fluids.    Rolan Bucco, MD 08/11/13 1218

## 2013-08-11 NOTE — Preoperative (Signed)
Beta Blockers   Reason not to administer Beta Blockers:Not Applicable 

## 2013-08-11 NOTE — ED Notes (Signed)
Bed: ZO10 Expected date:  Expected time:  Means of arrival:  Comments: EMS abd. Pain/n/v

## 2013-08-11 NOTE — Op Note (Signed)
Re:   Nicholas Sanford DOB:   [birth year] MRN:   [id number]                   FACILITY:  [facility]  DATE OF PROCEDURE: 08/11/2013                              OPERATIVE REPORT  PREOPERATIVE DIAGNOSIS:  Appendicitis  POSTOPERATIVE DIAGNOSIS:  Acute gangrenous appendicitis (with purulence)  PROCEDURE:  Laparoscopic appendectomy.  SURGEON:  Sandria Bales. Ezzard Standing, MD  ASSISTANT:  No first assistant.  ANESTHESIA:  General endotracheal.  Anesthesiologist: Gaetano Hawthorne, MD CRNA: Florene Route, CRNA  ASA:  2E  ESTIMATED BLOOD LOSS:  Minimal.  DRAINS: none   SPECIMEN:   Appendix  COUNTS CORRECT:  YES  INDICATIONS FOR PROCEDURE: Nicholas Sanford is a 27 y.o. (DOB: 14-Apr-1986) Hispanic  male whose primary care doctor is No PCP Per Patient and comes to the OR for an appendectomy.   I discussed with the patient, the indications and potential complications of appendiceal surgery.  The potential complications include, but are not limited to, bleeding, open surgery, bowel resection, and the possibility of another diagnosis.  OPERATIVE NOTE:  The patient underwent a general endotracheal anesthetic as supervised by Anesthesiologist: Gaetano Hawthorne, MD CRNA: Florene Route, CRNA, General, in room #1 at [facility] OR.  The patient was given 1 gm Invanz at the beginning of the procedure and the abdomen was prepped with ChloraPrep.  The patient had a foley catheter placed at the beginning of the procedure.  A time-out was held and surgical checklist run.  An infraumbilical incision was made with sharp dissection carried down to the abdominal cavity.  An 12 mm Hasson trocar was inserted through the infraumbilical incision and into the peritoneal cavity.  A 0 degree 10 mm laparoscope was inserted through a 12 mm Hasson trocar and the Hasson trocar secured with a 0 Vicryl suture.  I placed a 5 mm trocar in the right upper quadrant and 11 mm torcar in left lower quadrant and did abdominal exploration.    The right and left  lobes of liver unremarkable.  Stomach was unremarkable.  The patient has a lot of intra-abdominal fat which limited seeing most of his bowel.  I saw no other intra-abdominal abnormality.  The patient had appendicitis with the appendix located retrocecal and above the right pelvic brim.  The appendix was infarcted with purulence.  The mesentery of the appendix was divided with a Harmonic scalpel.  I got to the base of the appendix.  I then used a blue load 45 mm Ethicon Endo-GIA stapler and fired this across the base of the appendix.  I placed the appendix in EndoCatch bag and delivered the bag through the umbilical incision.  I irrigated the abdomen with 1,000 cc of saline.  After irrigating the abdomen, I then removed the trocars, in turn.  The umbilical port fascia was closed with 0 Vicryl suture.   I closed the skin each site with a 5-0 Monocryl suture and painted the wounds with Dermabond.  I then injected a total of 30 mL of 0.25% Marcaine at the incisions.  Sponge and needle count were correct at the end of the case.  The foley catheter was removed in the OR.  The patient was transferred to the recovery room in good condition.  The patient tolerated the procedure well and it depends on the patient's  post op clinical course as to when he could be discharged.   Ovidio Kin, MD, [facility] Pager: [phone] Office phone:  [phone]

## 2013-08-11 NOTE — Transfer of Care (Signed)
Immediate Anesthesia Transfer of Care Note  Patient: Nicholas Sanford  Procedure(s) Performed: Procedure(s): APPENDECTOMY LAPAROSCOPIC (N/A)  Patient Location: PACU  Anesthesia Type:General  Level of Consciousness: sedated  Airway & Oxygen Therapy: Patient Spontanous Breathing and Patient connected to face mask oxygen  Post-op Assessment: Report given to PACU RN and Post -op Vital signs reviewed and stable  Post vital signs: Reviewed and stable  Complications: No apparent anesthesia complications

## 2013-08-11 NOTE — Anesthesia Procedure Notes (Signed)
Procedure Name: Intubation Date/Time: 08/11/2013 3:19 PM Performed by: Leroy Libman L Patient Re-evaluated:Patient Re-evaluated prior to inductionOxygen Delivery Method: Circle system utilized Preoxygenation: Pre-oxygenation with 100% oxygen Intubation Type: IV induction, Rapid sequence and Cricoid Pressure applied Laryngoscope Size: Miller and 3 Grade View: Grade I Tube type: Oral Tube size: 7.5 mm Number of attempts: 1 Airway Equipment and Method: Stylet Placement Confirmation: ETT inserted through vocal cords under direct vision,  breath sounds checked- equal and bilateral and positive ETCO2 Secured at: 21 cm Tube secured with: Tape Dental Injury: Teeth and Oropharynx as per pre-operative assessment

## 2013-08-12 ENCOUNTER — Encounter (HOSPITAL_COMMUNITY): Payer: Self-pay | Admitting: Surgery

## 2013-08-12 MED ORDER — HYDROCODONE-ACETAMINOPHEN 5-325 MG PO TABS: 1.0000 | ORAL_TABLET | ORAL | Status: DC | PRN

## 2013-08-12 MED ORDER — IBUPROFEN 200 MG PO TABS: ORAL_TABLET | ORAL | Status: DC

## 2013-08-12 NOTE — Progress Notes (Signed)
Physician Discharge Summary  Patient ID: Nicholas Sanford MRN: [id number] DOB/AGE: 1986-02-11 27 y.o.  Admit date: 08/11/2013 Discharge date: 08/12/2013  Admission Diagnoses: Appendicitis   Discharge Diagnoses:  Acute gangrenous appendicitis (with purulence)  S/p Laparoscopic appendectomy. 08/11/2013, Kandis Cocking, MD.  Hx of Asthma  Body mass index is 35.75 kg/(m^2).  Active Problems:   Acute appendicitis   Appendicitis   PROCEDURES: Laparoscopic appendectomy.08/11/2013, Kandis Cocking, MD  Hospital Course: Nicholas Sanford is a 27 y.o. (DOB: [birth year]) hispanic male whose primary care physician is No PCP Per Patient and comes to the [facility] ER with increasing abdominal pain.  His brother, [name], and brother in law, [name], are in the ER with him.  He has no prior GI history. He developed abdominal pain yesterday AM which worsened as the day progressed. He didn't eat anything all day, and stayed in bed. He vomited early this AM. He came to the [facility] today because of continued abdominal pain.  CT scan - Acute appendicitis - 08/11/2013 He was admitted and taken to the OR.  He did well post op.  His diet was advanced, and he was ready to go by the afternoon of 08/12/13.  He will follow up in two weeks.  We used the Spanish interpretors to help with language issues.  Condition on d/c:  Improved.   Disposition: 01-Home or Self Care   Future Appointments Provider Department Dept Phone   09/03/2013 2:00 PM [facility], [place] [phone]       Medication List         albuterol 108 (90 BASE) MCG/ACT inhaler  Commonly known as:  PROVENTIL HFA;VENTOLIN HFA  Inhale 2 puffs into the lungs every 6 (six) hours as needed for wheezing or shortness of breath.     beclomethasone 80 MCG/ACT inhaler  Commonly known as:  QVAR  Inhale into the lungs 2 (two) times daily.     cetirizine 10 MG tablet  Commonly known as:  ZYRTEC  Take 1 tablet (10 mg total) by mouth daily.     HYDROcodone-acetaminophen 5-325 MG per tablet  Commonly known as:  NORCO/VICODIN  Take 1-2 tablets by mouth every 4 (four) hours as needed for moderate pain.     ibuprofen 200 MG tablet  Commonly known as:  ADVIL,MOTRIN  You can take 2-3 tablets every 6 hours for pain.     predniSONE 10 MG tablet  Commonly known as:  DELTASONE  Take 10 mg by mouth daily with breakfast.           Follow-up Information   Follow up with [facility] On 09/03/2013. (Be at the office at 1:30 for 2 PM appointment.)    Contact information:   [address]   [place] [address] [phone]       Follow up with No PCP Per Patient. (contract your primary care provider to help you with your asthma medicine.)    Specialty:  General Practice   Contact information:   [address] [place] [address] [phone]       Signed: Sherrie George 08/12/2013, 5:48 PM

## 2013-08-12 NOTE — Care Management Note (Signed)
    Page 1 of 1   08/12/2013     11:45:47 AM   CARE MANAGEMENT NOTE 08/12/2013  Patient:  Nicholas Sanford,Nicholas Sanford   Account Number:  [id number]  Date Initiated:  08/12/2013  Documentation initiated by:  Lorenda Ishihara  Subjective/Objective Assessment:   27 yo male admitted s/p lap appy, Acute gangrenous appendicitis. PTA lived at home with spouse.     Action/Plan:   Home when stable   Anticipated DC Date:  08/13/2013   Anticipated DC Plan:  HOME/SELF CARE      DC Planning Services  CM consult      Choice offered to / List presented to:             Status of service:  Completed, signed off Medicare Important Message given?   (If response is "NO", the following Medicare IM given date fields will be blank) Date Medicare IM given:   Date Additional Medicare IM given:    Discharge Disposition:  HOME/SELF CARE  Per UR Regulation:  Reviewed for med. necessity/level of care/duration of stay  If discussed at Long Length of Stay Meetings, dates discussed:    Comments:

## 2013-08-12 NOTE — Progress Notes (Signed)
1 Day Post-Op  Subjective: Feels better taking PO well, very sore.  Objective: Vital signs in last 24 hours: Temp:  [97.9 F (36.6 C)-100 F (37.8 C)] 97.9 F (36.6 C) (12/08 0530) Pulse Rate:  [74-95] 74 (12/08 0530) Resp:  [11-18] 18 (12/08 0530) BP: (96-131)/(59-79) 131/79 mmHg (12/08 0530) SpO2:  [90 %-100 %] 92 % (12/08 0530) Weight:  [105.144 kg (231 lb 12.8 oz)] 105.144 kg (231 lb 12.8 oz) (12/07 1900) Last BM Date: 08/11/13  Intake/Output from previous day: 12/07 0701 - 12/08 0700 In: 2570 [I.V.:2570] Out: 3425 [Urine:3400; Blood:25] Intake/Output this shift:    General appearance: alert, cooperative and no distress GI: soft tender, +BS, sites ok, no BM  Lab Results:   Recent Labs  08/11/13 0850  WBC 28.3*  HGB 16.0  HCT 49.2    BMET  Recent Labs  08/11/13 1036  NA 131*  K 3.2*  CL 93*  CO2 24  GLUCOSE 136*  BUN 13  CREATININE 0.77  CALCIUM 9.3   PT/INR No results found for this basename: LABPROT, INR,  in the last 72 hours  No results found for this basename: AST, ALT, ALKPHOS, BILITOT, PROT, ALBUMIN,  in the last 168 hours   Lipase  No results found for this basename: lipase     Studies/Results: Ct Abdomen Pelvis W Contrast  08/11/2013   CLINICAL DATA:  Right lower quadrant pain  EXAM: CT ABDOMEN AND PELVIS WITH CONTRAST  TECHNIQUE: Multidetector CT imaging of the abdomen and pelvis was performed using the standard protocol following bolus administration of intravenous contrast.  CONTRAST:  50mL OMNIPAQUE IOHEXOL 300 MG/ML SOLN, OMNIPAQUE IOHEXOL 300 MG/ML SOLN  COMPARISON:  None.  FINDINGS: Small amount of oral contrast material in distal esophagus probable from gastroesophageal reflux.  Lung bases are unremarkable. Sagittal images of the spine are unremarkable.  Liver, spleen, pancreas and adrenals are unremarkable. No aortic aneurysm. No calcified gallstones are noted within gallbladder. Kidneys are symmetrical in size and enhancement.  No hydronephrosis or hydroureter.  There is abnormal enlargement and enhancement of the appendix appendix measures 1.3 cm in diameter in axial image 52. There is stranding of surrounding fat as trace periappendiceal fluid. Findings are consistent with acute appendicitis.  The urinary bladder is unremarkable. Prostate gland and seminal vesicles are unremarkable. No distal colonic obstruction. No inguinal adenopathy.  IMPRESSION: 1. There is abnormal enlargement of the appendix with stranding of surrounding fat and small amount of periappendiceal fluid. Findings are consistent with acute appendicitis. 2. No small bowel obstruction. 3. No hydronephrosis or hydroureter. Critical Value/emergent results were called by telephone at the time of interpretation on 08/11/2013 at 11:48 AM to Dr.MELANIE BELFI , who verbally acknowledged these results.   Electronically Signed   By: Natasha Mead M.D.   On: 08/11/2013 11:48   Dg Abd Acute W/chest  08/11/2013   CLINICAL DATA:  Abdominal pain  EXAM: ACUTE ABDOMEN SERIES (ABDOMEN 2 VIEW & CHEST 1 VIEW)  COMPARISON:  None.  FINDINGS: There is no evidence of dilated bowel loops or free intraperitoneal air. No radiopaque calculi or other significant radiographic abnormality is seen. Heart size and mediastinal contours are within normal limits. Both lungs are clear.  IMPRESSION: Negative abdominal radiographs.  No acute cardiopulmonary disease.   Electronically Signed   By: Natasha Mead M.D.   On: 08/11/2013 10:01    Medications: . ertapenem (INVANZ) IV  1 g Intravenous Q24H  . fluticasone  1 puff Inhalation BID  .  heparin subcutaneous  5,000 Units Subcutaneous Q8H  . influenza vac split quadrivalent PF  0.5 mL Intramuscular Tomorrow-1000  . predniSONE  10 mg Oral Q breakfast    Assessment/Plan Acute gangrenous appendicitis (with purulence) S/p Laparoscopic appendectomy. 08/11/2013,  Kandis Cocking, MD. Hx of Asthma Body mass index is 35.75 kg/(m^2).   Plan:  He has  pancakes today, no sites Ok still sore,  English is limited, he says he is currently unemployed, lives with GF and daughter. I have ask them to get [place] a Spanish interpreter and I will discuss discharge with them at that time.       LOS: 1 day     Nicholas Sanford 08/12/2013

## 2013-08-12 NOTE — Progress Notes (Signed)
Discharge instructions reviewed with pt and his family.  Interpreter was present when discharge information was given. Pt verbalized understanding.  Interpreter gave pt phone number to spanish speaking financial counselor for any questions he may have. No concerns at time of discharge.

## 2013-08-13 NOTE — Discharge Summary (Signed)
Physician Discharge Summary  Patient ID: Nicholas Sanford MRN: [id number] DOB/AGE: [birth year] 28 y.o.  Admit date: 08/11/2013 Discharge date: 08/13/2013  Admission Diagnoses:  Appendicitis    Discharge Diagnoses: Acute gangrenous appendicitis (with purulence)  Active Problems:   Acute appendicitis   Appendicitis   PROCEDURES: Laparoscopic appendectomy.  08/11/2013, Kandis Cocking, MD   Hospital Course: Nicholas Sanford is a 27 y.o. (DOB: 19-Aug-1986) hispanic male whose primary care physician is No PCP Per Patient and comes to the [facility] ER with increasing abdominal pain.  His brother, Nicholas Sanford, and brother in law, Nicholas Sanford, are in the ER with him.  He has no prior GI history. He developed abdominal pain yesterday AM which worsened as the day progressed. He didn't eat anything all day, and stayed in bed. He vomited early this AM. He came to the [facility] today because of continued abdominal pain.  He was seen and taken to the OR by Dr. Ezzard Standing. He did well post op. His diet was advanced, and he was ready to go by the afternoon of 08/12/13. He will follow up in two weeks. We used the Spanish interpretors to help with language issues.  Condition on d/c: Improved.         Disposition: 01-Home or Self Care       Future Appointments Provider Department Dept Phone   09/03/2013 2:00 PM [facility] Surgery, [place] [phone]       Medication List         albuterol 108 (90 BASE) MCG/ACT inhaler  Commonly known as:  PROVENTIL HFA;VENTOLIN HFA  Inhale 2 puffs into the lungs every 6 (six) hours as needed for wheezing or shortness of breath.     beclomethasone 80 MCG/ACT inhaler  Commonly known as:  QVAR  Inhale into the lungs 2 (two) times daily.     cetirizine 10 MG tablet  Commonly known as:  ZYRTEC  Take 1 tablet (10 mg total) by mouth daily.     HYDROcodone-acetaminophen 5-325 MG per tablet  Commonly known as:  NORCO/VICODIN  Take 1-2 tablets by mouth every 4 (four)  hours as needed for moderate pain.     ibuprofen 200 MG tablet  Commonly known as:  ADVIL,MOTRIN  You can take 2-3 tablets every 6 hours for pain.     predniSONE 10 MG tablet  Commonly known as:  DELTASONE  Take 10 mg by mouth daily with breakfast.       Follow-up Information   Follow up with [facility] On 09/03/2013. (Be at the office at 1:30 for 2 PM appointment.)    Contact information:   [address]   [place] [address] [phone]       Follow up with No PCP Per Patient. (contract your primary care provider to help you with your asthma medicine.)    Specialty:  General Practice   Contact information:   [address] [place] [address] [phone]       Signed: Sherrie George 08/13/2013, 5:27 PM

## 2013-09-03 ENCOUNTER — Encounter (INDEPENDENT_AMBULATORY_CARE_PROVIDER_SITE_OTHER): Payer: Self-pay

## 2013-09-03 ENCOUNTER — Ambulatory Visit (INDEPENDENT_AMBULATORY_CARE_PROVIDER_SITE_OTHER): Payer: Medicaid Other | Admitting: General Surgery

## 2013-09-03 VITALS — BP 122/78 | HR 74 | Temp 97.5°F | Resp 16 | Ht 68.0 in | Wt 236.2 lb

## 2013-09-03 DIAGNOSIS — K3580 Unspecified acute appendicitis: Secondary | ICD-10-CM

## 2013-09-03 NOTE — Progress Notes (Signed)
[facility] [birth year] [id number] 09/03/2013   History of Present Illness: Nicholas Sanford is a  27 y.o. male who presents today status post lap appy by Dr. Ovidio Kin.  Pathology reveals acute appendicitis with periappendicitis.  The patient is tolerating a regular diet, having normal bowel movements, has good pain control.  He  is back to most normal activities.   Physical Exam: Abd: soft, nontender, active bowel sounds, nondistended.  All incisions are well healed.  Impression: 1.  Acute appendicitis, s/p lap appy  Plan: He  is able to return to normal activities. He  may follow up on a prn basis.

## 2013-09-03 NOTE — Patient Instructions (Signed)
May resume normal activities. Follow up as needed. 

## 2014-04-20 ENCOUNTER — Ambulatory Visit (INDEPENDENT_AMBULATORY_CARE_PROVIDER_SITE_OTHER): Payer: Self-pay | Admitting: Family Medicine

## 2014-04-20 VITALS — BP 128/90 | HR 83 | Temp 97.8°F | Resp 18 | Ht 67.5 in | Wt 234.0 lb

## 2014-04-20 DIAGNOSIS — J45901 Unspecified asthma with (acute) exacerbation: Secondary | ICD-10-CM

## 2014-04-20 DIAGNOSIS — J4551 Severe persistent asthma with (acute) exacerbation: Secondary | ICD-10-CM

## 2014-04-20 MED ORDER — PREDNISONE 20 MG PO TABS: 40.0000 mg | ORAL_TABLET | Freq: Every day | ORAL | Status: DC

## 2014-04-20 MED ORDER — FLUTICASONE FUROATE-VILANTEROL 200-25 MCG/INH IN AEPB: 1.0000 | INHALATION_SPRAY | Freq: Every day | RESPIRATORY_TRACT | Status: DC

## 2014-04-20 NOTE — Patient Instructions (Signed)
Prevencin de los ataques de asma (Asthma Attack Prevention) Aunque no hay modo de prevenir el inicio de un ataque de asma, puede seguir estas indicaciones para controlar la enfermedad y reducir los sntomas. Aprenda sobre el asma y [place]. Tome un papel activo para controlar su asma, trabajando con su mdico para crear y seguir un plan de accin. Un plan de accin para el asma puede guiarlo para:  Tomar los [employer].  Evitar aquellas cosas que provocan el ataque de asma o hacen que empeore (desencadenantes del asma).  Controlar su nivel de asma.  Actuar si el asma empeora.  Buscar atencin mdica de emergencia cuando lo necesite. Para el control del asma, lleve un registro de sus sntomas, verifique su valor de flujo pico mediante un dispositivo de mano que mide cmo el aire sale de los pulmones (medidor de flujo espiratorio mximo) y Recruitment consultant controles regulares del asma.  CULES SON LAS FORMAS DE PREVENIR UN ATAQUE DE ASMA?  Tome todos los [employer] como le indic el mdico.  Lleve un registro de los sntomas de asma y [place] de control.  Junto con su mdico, elabore un plan detallado para el uso de medicamentos y el manejo de un ataque de asma. Luego asegrese de Financial risk analyst de accin. El asma es una enfermedad crnica que requiere controles y tratamiento regulares.  Identifique y evite los desencadenantes del asma. Una serie de alergenos externos e irritantes (el polen, el moho, el aire fro, la contaminacin del aire) pueden desencadenar ataques de asma. Averige cules son los desencadenantes del asma y tome medidas para evitarlos.  Controle su respiracin. Aprenda a reconocer los signos de alerta de un ataque, como tos, sibilancias o dificultad para respirar. La funcin pulmonar puede disminuir antes de que note cualquier signo o sntoma, por lo tanto, mida y registre regularmente el flujo pico con un medidor de flujo mximo casero.  Identifique y  trate los ataques antes de que se produzcan. Si acta rpidamente, es menos probable que tenga un ataque grave. Tambin necesitar menos medicamentos para controlar sus sntomas. Cuando las mediciones de flujo mximo disminuyan y Visual merchandiser sobre un prximo ataque, tome los medicamentos segn las indicaciones y Production assistant, radio de inmediato cualquier actividad que pudiera haber desencadenado el ataque. Si sus sntomas no mejoran, pida ayuda mdica.  Preste atencin si necesita aumentar el uso del inhalador de [place] rpido. Si debe depender del inhalador de alivio rpido, el asma no est bajo control. Consulte a su mdico acerca de cmo ajustar su tratamiento. QU PUEDE EMPEORAR LOS SNTOMAS? Ciertos factores pueden hacer que los sntomas del asma empeoren y causen un aumento transitorio de la inflamacin en las vas respiratorias. Lleve un registro de sus sntomas durante algunas semanas, detallando todos los factores ambientales y emocionales vinculados con el asma. Si tiene un ataque de asma, vuelva a su diario para ver qu factor o combinacin de factores podran haber contribuido. Una vez que conozca esos factores, puede tomar medidas para controlar muchos de ellos. Si sufre alergias y asma, es importante tomar medidas de prevencin del asma en el hogar. Si minimiza el contacto con la sustancia a la que es alrgico podr prevenir los ataques de asma. Algunos desencadenantes y sus modos de evitarlos son: Caspa animal:  Algunas personas son Camera operator a las escamas de la piel o la saliva seca de los animales con pelo o plumas.   No existen razas de perros o gatos que sean incapaces de Social research officer, government. [employer]  perros o gatos pueden causar Set designer, aunque no muden el pelaje.  Mantenga las mascotas afuera de la casa.  Si no puede mantener a Oncologist, squelas fuera de la habitacin y de las reas en las que duerme y Quarry manager la puerta cerrada.  Quite de su casa las alfombras y los muebles  cubiertos con telas. Si eso no es posible, mantenga las mascotas lejos de los muebles cubiertos de tela y de las alfombras. caros del polvo: Muchas personas con asma son alrgicas a los caros del polvo. Los caros del polvo son insectos diminutos que se encuentran en todos los hogares, en los colchones, [place], alfombras, muebles cubiertos de telas, [place], ropa, juguetes de peluche y otros artculos cubiertos con tela.   Cubra el colchn con una cubierta especial a prueba de polvo.  Cubra la almohada con una cubierta especial a prueba de polvo, o lave la almohada todas las semanas con agua caliente. El agua debe estar a ms de 130  F (54,5 C) para matar los caros del polvo. El agua fra o caliente con detergente y lavandina tambin puede ser eficaz.  Valrico sbanas y las mantas de su cama semanalmente en agua caliente.  Trate de no dormir o acostarse sobre almohadones forrados en tela.  Si viaja, llame con anticipacin para pedir una habitacin de hotel para no fumadores. Lleve su propia ropa de cama y almohadas, en caso de que el hotel slo suministre almohadas y edredones de plumas, que pueden contener caros del polvo y causar sntomas de asma.  Quite las alfombras de su dormitorio y las adheridas al cemento, si se puede.  Mantenga los juguetes de peluche fuera de la cama o lave los juguetes semanalmente en agua caliente o agua fra con detergente y lavandina. [place]: Muchas personas que sufren asma son alrgicas a las heces y restos [place].   Mantenga los alimentos y las bebidas en contenedores cerrados. Nunca deje comida a la vista.  Use venenos, trampas, polvos, geles o pastas (por ejemplo cido brico).  Si [place] un aerosol para exterminar las cucarachas, permanezca fuera de la habitacin hasta que el olor desaparezca. Moho en el interior:  Arregle las caeras que pierdan agua u otras fuentes de agua que tengan moho alrededor.  Limpie los pisos y las  superficies con moho con un fungicida o lavandina diluida.  Evite el uso de humidificadores, vaporizadores o enfriantes hmedos. Pueden diseminar el moho a travs del aire. Polen y moho en el exterior:  Cuando hay gran cantidad de esporas de polen o moho, trate de Theatre manager las ventanas cerradas.  Permanezca dentro de la habitacin con las ventanas cerradas desde las ltimas horas de la maana hasta la tarde. Hay ms cantidad de esporas de polen en ese momento.  Consulte con su mdico si usted necesita tomar o aumentar las dosis de antiinflamatorios antes de que comience la [place] de Buyer, retail. Otros irritantes que debe evitar:  El humo del tabaco es un irritante. Si fuma, pregunte a su mdico cmo puede dejar de fumar. Tambin pida a los miembros de su familia que dejen de fumar. No permita que fumen en su casa ni en el automvil.  En lo posible, no utilice un horno a lea, una estufa a querosene o Nurse, mental health. Minimice la exposicin a toda fuente de humo, inclusive incienso, velas, fogatas o fuegos artificiales.  Trate de [employer] alejado de los olores fuertes y los aerosoles como perfumes, talco, spray para el cabello  y las pinturas.  Disminuya la humedad en su casa y use un dispositivo de limpieza del aire interior. Reduzca la humedad interior a menos del 60 por ciento. Los deshumidificadores o los acondicionadores de aire central pueden [place].  Disminuya la exposicin al polvo cambiando con frecuencia los filtros de hornos y acondicionadores de Occupational hygienist.  Trate de que alguien pase la aspiradora una o dos veces por semana. Permanezca fuera de las habitaciones mientras son aspiradas y por algn tiempo despus.  Si usted pasa la [place], use una mscara para polvo de las que se consiguen en la [place], una bolsa de aspiradora de doble capa o microfiltro o una aspiradora con un filtro HEPA.  Los sulfitos que contienen los alimentos y las bebidas pueden ser irritantes. No beba cerveza ni  vino, ni consuma frutas secas, patatas procesadas o langostinos, si estos le producen sntomas de asma.  El aire fro puede desencadenar un ataque de asma. Cbrase la nariz y la boca con una bufanda en [place] o ventosos.  Hay varios problemas de salud que pueden hacer que el asma sea ms difcil de [place], como tener secrecin nasal, sinusitis, enfermedad por reflujo, estrs psicolgico y apnea del sueo. Colabore con los profesionales que lo asisten para Government social research officer.  Evite el contacto cercano con personas que tengan una infeccin respiratoria como resfro o gripe, ya que los sntomas de asma pueden empeorar si se contagia la infeccin. Lvese bien las manos despus de tocar objetos que puedan haber sido manipulados por personas con una infeccin respiratoria.  Vacnese contra la gripe todos los aos para protegerse contra el virus de la gripe, que con frecuencia empeora el asma durante varios das o [place]. Tambin aplquese la vacuna contra la neumona si no lo ha hecho antes. A diferencia de la vacuna contra la gripe, la vacuna contra la neumona no debe aplicarse todos los [place]. Medicamentos:  Hable con su mdico acerca de si es seguro que tome aspirina o antiinflamatorios no esteroides (AINES). En un nmero pequeo de personas con asma, la aspirina y los AINES pueden causar ataques de asma. Las personas que han padecido asma por sensibilidad a Agricultural engineer. Es importante que las personas con asma por sensibilidad a la aspirina [employer] etiquetas de todos los medicamentos de venta libre que utilizan para tratar el dolor, el resfro, la tos y [place].  Los betabloqueantes y los inhibidores ACE son otros medicamentos cuyo uso debe consultar con su mdico. CMO PUEDO AVERIGUAR A QU [place]? Consulte a su mdico acerca de las pruebas de alergia en la piel o anlisis de [place] (test de RAST) para identificar los alergenos a los cuales usted  es sensible. Si le diagnostican que sufre alergias, lo ms importante es tratar de [employer] exposicin a los alrgenos a los que es sensible, siempre que pueda. Se dispone de otros tratamientos para la alergia, como medicamentos y vacunas contra la alergia (inmunoterapia).  [place]? Siga las indicaciones de su mdico con respecto al tratamiento para el asma antes de hacer actividad fsica. Es importante que siga un programa regular de actividad fsica, pero el ejercicio vigoroso, o los que se realizan en lugares fros, hmedos o secos pueden causar ataques de asma, especialmente en aquellas personas que sufren asma inducido por el ejercicio. Document Released: 08/08/2012 Document Revised: 04/24/2013 [facility] Patient Information 2015 [place]. This information is not intended to replace advice given to you by your health care  provider. Make sure you discuss any questions you have with your health care provider. Asthma Attack Prevention Although there is no way to prevent asthma from starting, you can take steps to control the disease and reduce its symptoms. Learn about your asthma and how to control it. Take an active role to control your asthma by working with your health care provider to create and follow an asthma action plan. An asthma action plan guides you in:  Taking your medicines properly.  Avoiding things that set off your asthma or make your asthma worse (asthma triggers).  Tracking your level of asthma control.  Responding to worsening asthma.  Seeking emergency care when needed. To track your asthma, keep records of your symptoms, check your peak flow number using a handheld device that shows how well air moves out of your lungs (peak flow meter), and get regular asthma checkups.  WHAT ARE SOME WAYS TO PREVENT AN ASTHMA ATTACK?  Take medicines as directed by your health care provider.  Keep track of your asthma symptoms and level of control.  With  your health care provider, write a detailed plan for taking medicines and managing an asthma attack. Then be sure to follow your action plan. Asthma is an ongoing condition that needs regular monitoring and treatment.  Identify and avoid asthma triggers. Many outdoor allergens and irritants (such as pollen, mold, cold air, and air pollution) can trigger asthma attacks. Find out what your asthma triggers are and take steps to avoid them.  Monitor your breathing. Learn to recognize warning signs of an attack, such as coughing, wheezing, or shortness of breath. Your lung function may decrease before you notice any signs or symptoms, so regularly measure and record your peak airflow with a home peak flow meter.  Identify and treat attacks early. If you act quickly, you are less likely to have a severe attack. You will also need less medicine to control your symptoms. When your peak flow measurements decrease and alert you to an upcoming attack, take your medicine as instructed and immediately stop any activity that may have triggered the attack. If your symptoms do not improve, get medical help.  Pay attention to increasing quick-relief inhaler use. If you find yourself relying on your quick-relief inhaler, your asthma is not under control. See your health care provider about adjusting your treatment. WHAT CAN MAKE MY SYMPTOMS WORSE? A number of common things can set off or make your asthma symptoms worse and cause temporary increased inflammation of your airways. Keep track of your asthma symptoms for several weeks, detailing all the environmental and emotional factors that are linked with your asthma. When you have an asthma attack, go back to your asthma diary to see which factor, or combination of factors, might have contributed to it. Once you know what these factors are, you can take steps to control many of them. If you have allergies and asthma, it is important to take asthma prevention steps at home.  Minimizing contact with the substance to which you are allergic will help prevent an asthma attack. Some triggers and ways to avoid these triggers are: Animal Dander:  Some people are allergic to the flakes of skin or dried saliva from animals with fur or feathers.   There is no such thing as a hypoallergenic dog or cat breed. All dogs or cats can cause allergies, even if they don't shed.  Keep these pets out of your home.  If you are not able to keep a  pet outdoors, keep the pet out of your bedroom and other sleeping areas at all times, and keep the door closed.  Remove carpets and furniture covered with cloth from your home. If that is not possible, keep the pet away from fabric-covered furniture and carpets. Dust Mites: Many people with asthma are allergic to dust mites. Dust mites are tiny bugs that are found in every home in mattresses, pillows, carpets, fabric-covered furniture, bedcovers, clothes, stuffed toys, and other fabric-covered items.   Cover your mattress in a special dust-proof cover.  Cover your pillow in a special dust-proof cover, or wash the pillow each week in hot water. Water must be hotter than 130 F (54.4 C) to kill dust mites. Cold or warm water used with detergent and bleach can also be effective.  Wash the sheets and blankets on your bed each week in hot water.  Try not to sleep or lie on cloth-covered cushions.  Call ahead when traveling and ask for a smoke-free hotel room. Bring your own bedding and pillows in case the hotel only supplies feather pillows and down comforters, which may contain dust mites and cause asthma symptoms.  Remove carpets from your bedroom and those laid on concrete, if you can.  Keep stuffed toys out of the bed, or wash the toys weekly in hot water or cooler water with detergent and bleach. Cockroaches: Many people with asthma are allergic to the droppings and remains of cockroaches.   Keep food and garbage in closed containers.  Never leave food out.  Use poison baits, traps, powders, gels, or paste (for example, boric acid).  If a spray is used to kill cockroaches, stay out of the room until the odor goes away. Indoor Mold:  Fix leaky faucets, pipes, or other sources of water that have mold around them.  Clean floors and moldy surfaces with a fungicide or diluted bleach.  Avoid using humidifiers, vaporizers, or swamp coolers. These can spread molds through the air. Pollen and Outdoor Mold:  When pollen or mold spore counts are high, try to keep your windows closed.  Stay indoors with windows closed from late morning to afternoon. Pollen and some mold spore counts are highest at that time.  Ask your health care provider whether you need to take anti-inflammatory medicine or increase your dose of the medicine before your allergy season starts. Other Irritants to Avoid:  Tobacco smoke is an irritant. If you smoke, ask your health care provider how you can quit. Ask family members to quit smoking, too. Do not allow smoking in your home or car.  If possible, do not use a wood-burning stove, kerosene heater, or fireplace. Minimize exposure to all sources of smoke, including incense, candles, fires, and fireworks.  Try to stay away from strong odors and sprays, such as perfume, talcum powder, hair spray, and paints.  Decrease humidity in your home and use an indoor air cleaning device. Reduce indoor humidity to below 60%. Dehumidifiers or central air conditioners can do this.  Decrease house dust exposure by changing furnace and air cooler filters frequently.  Try to have someone else vacuum for you once or twice a week. Stay out of rooms while they are being vacuumed and for a short while afterward.  If you vacuum, use a dust mask from a hardware store, a double-layered or microfilter vacuum cleaner bag, or a vacuum cleaner with a HEPA filter.  Sulfites in foods and beverages can be irritants. Do not drink beer  or wine  or eat dried fruit, processed potatoes, or shrimp if they cause asthma symptoms.  Cold air can trigger an asthma attack. Cover your nose and mouth with a scarf on cold or windy days.  Several health conditions can make asthma more difficult to manage, including a runny nose, sinus infections, reflux disease, psychological stress, and sleep apnea. Work with your health care provider to manage these conditions.  Avoid close contact with people who have a respiratory infection such as a cold or the flu, since your asthma symptoms may get worse if you catch the infection. Wash your hands thoroughly after touching items that may have been handled by people with a respiratory infection.  Get a flu shot every year to protect against the flu virus, which often makes asthma worse for days or weeks. Also get a pneumonia shot if you have not previously had one. Unlike the flu shot, the pneumonia shot does not need to be given yearly. Medicines:  Talk to your health care provider about whether it is safe for you to take aspirin or non-steroidal anti-inflammatory medicines (NSAIDs). In a small number of people with asthma, aspirin and NSAIDs can cause asthma attacks. These medicines must be avoided by people who have known aspirin-sensitive asthma. It is important that people with aspirin-sensitive asthma read labels of all over-the-counter medicines used to treat pain, colds, coughs, and fever.  Beta-blockers and ACE inhibitors are other medicines you should discuss with your health care provider. HOW CAN I FIND OUT WHAT I AM ALLERGIC TO? Ask your asthma health care provider about allergy skin testing or blood testing (the RAST test) to identify the allergens to which you are sensitive. If you are found to have allergies, the most important thing to do is to try to avoid exposure to any allergens that you are sensitive to as much as possible. Other treatments for allergies, such as medicines and allergy  shots (immunotherapy) are available.  CAN I EXERCISE? Follow your health care provider's advice regarding asthma treatment before exercising. It is important to maintain a regular exercise program, but vigorous exercise or exercise in cold, humid, or dry environments can cause asthma attacks, especially for those people who have exercise-induced asthma. Document Released: 08/10/2009 Document Revised: 08/27/2013 Document Reviewed: 02/27/2013 [facility] Patient Information 2015 [place], [place]. This information is not intended to replace advice given to you by your health care provider. Make sure you discuss any questions you have with your health care provider.

## 2014-04-20 NOTE — Progress Notes (Signed)
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  He has been having increasing difficulty with wheezing and SOB since Friday, not relieved by QVar and Ventolin.  No fever  No 1st degree relatives with asthma.  No pets at home.  Nonsmoker.  No one smokes at home.  Has used prednisone in past   Objective:  Obviously short of breath with barrel chest and puffy facial features. HEENT unremarkable Chest:  Diffuse wheezes Heart:  Regular, rate around 90 Ext:  No edema  Improved comfort after neb with albuterol and atrovent  Assessment:  Severe persistent asthma.    Asthma with acute exacerbation, severe persistent - Plan: Fluticasone Furoate-Vilanterol (BREO ELLIPTA) 200-25 MCG/INH AEPB, predniSONE (DELTASONE) 20 MG tablet  Signed, Robyn Haber, MD

## 2014-05-13 IMAGING — CT CT ABD-PELV W/ CM
1 of 2 series · 15 of 32 positions shown, 19 images · IV contrast (omnipaque)
Comparison: None.

CLINICAL DATA: Right lower quadrant pain

EXAM:
CT ABDOMEN AND PELVIS WITH CONTRAST
TECHNIQUE: Multidetector CT imaging of the abdomen and pelvis was performed
using the standard protocol following bolus administration of
intravenous contrast.
CONTRAST:  50mL OMNIPAQUE IOHEXOL 300 MG/ML SOLN, 100mL OMNIPAQUE
IOHEXOL 300 MG/ML SOLN

[Series 2: abd/pel with · axial · 0.81mm/px · z∈[-516,-81]mm · 15 of 95 slices shown, 19 images]
[im 4/95  soft-tissue]
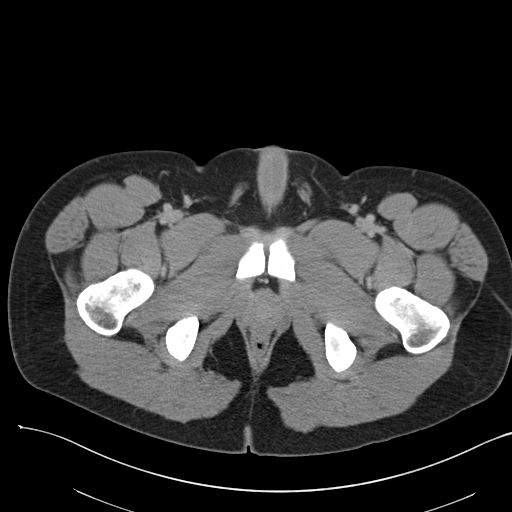
[im 4/95  bone]
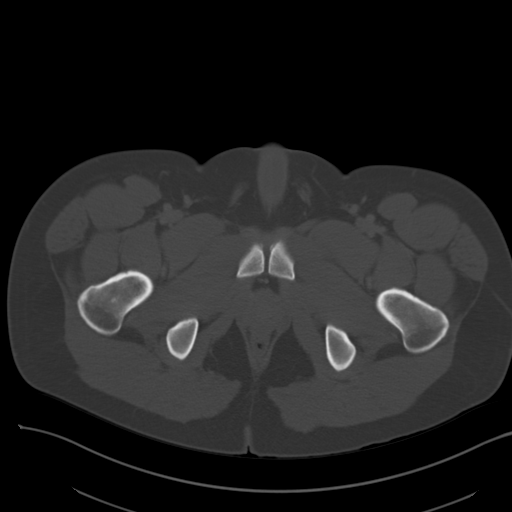
[im 11/95  soft-tissue]
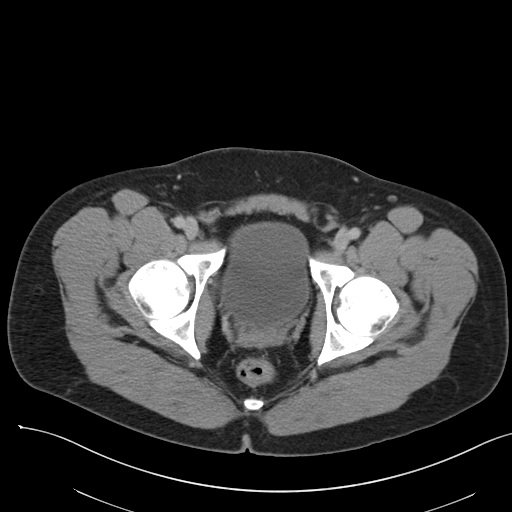
[im 19/95  soft-tissue]
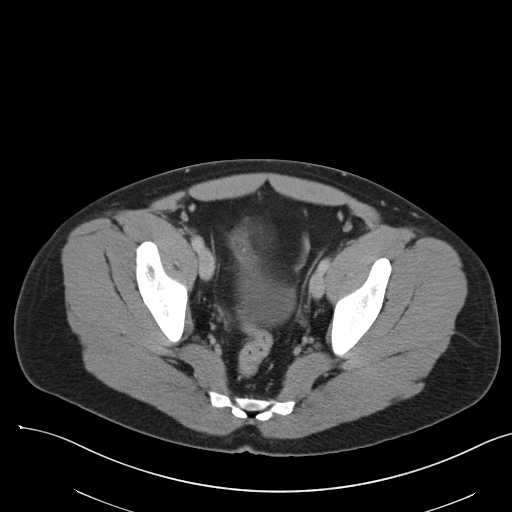
[im 26/95  soft-tissue]
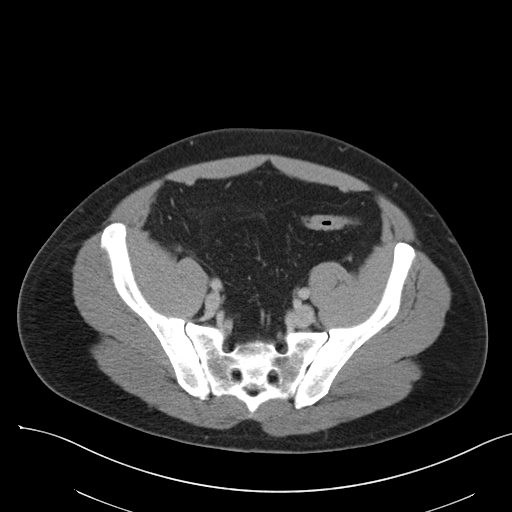
[im 33/95  soft-tissue]
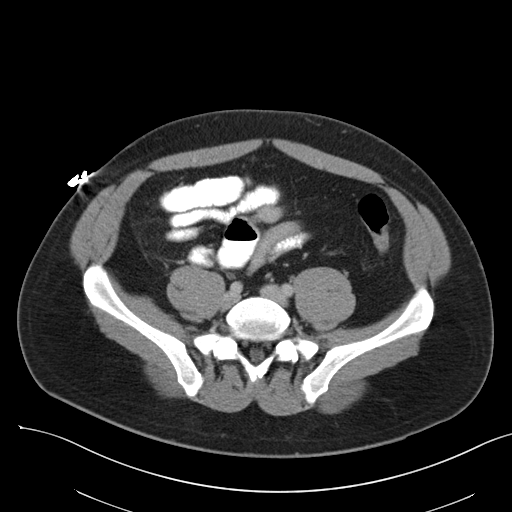
[im 40/95  soft-tissue]
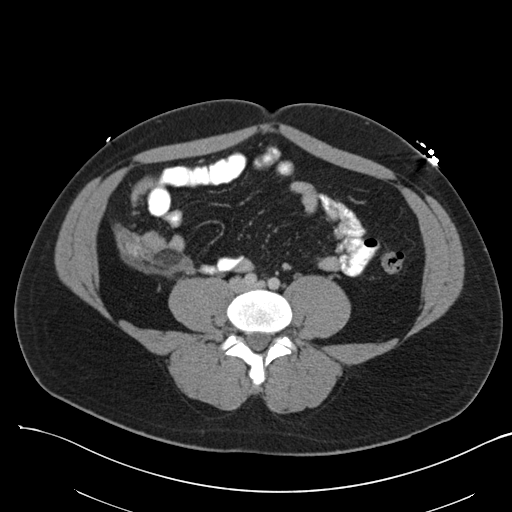
[im 48/95  soft-tissue]
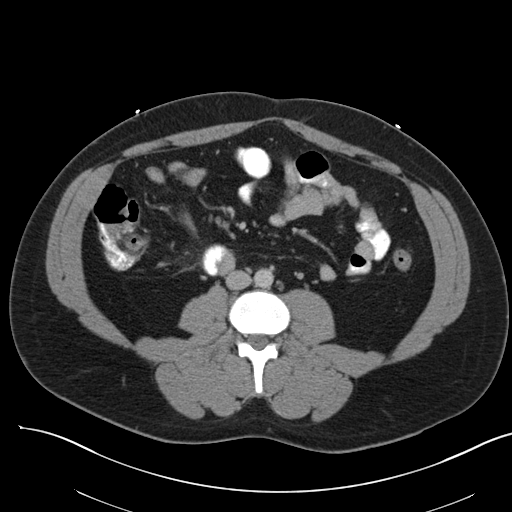
[im 55/95  soft-tissue]
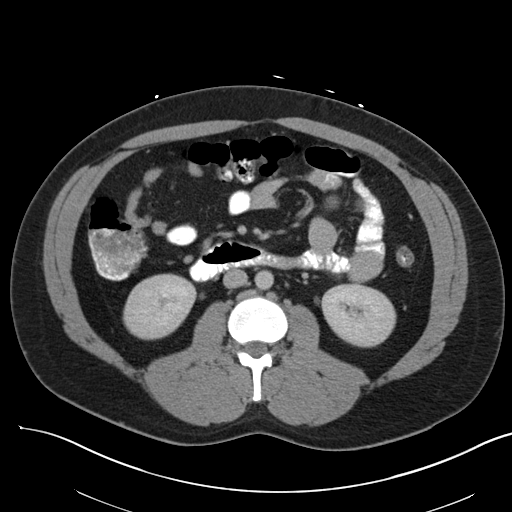
[im 62/95  soft-tissue]
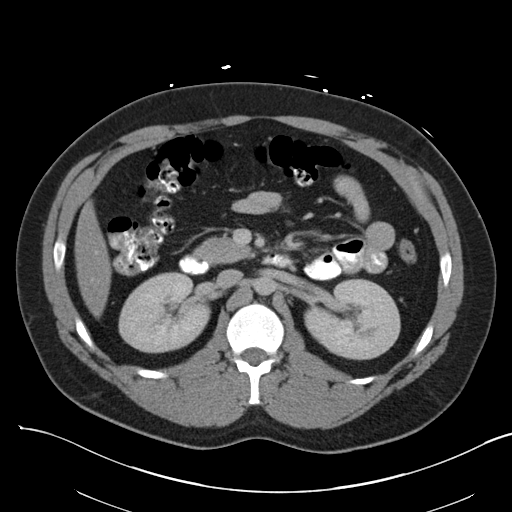
[im 62/95  bone]
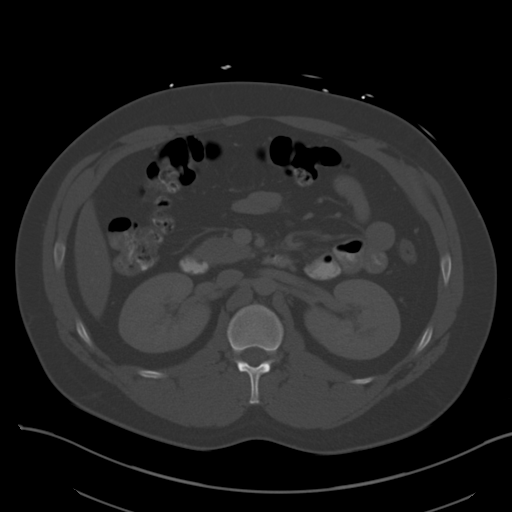
[im 69/95  soft-tissue]
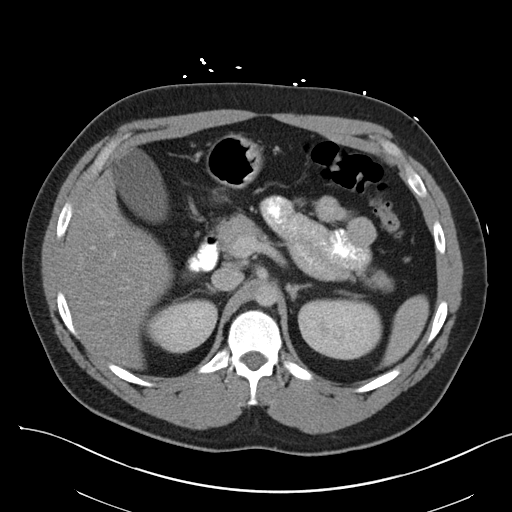
[im 76/95  soft-tissue]
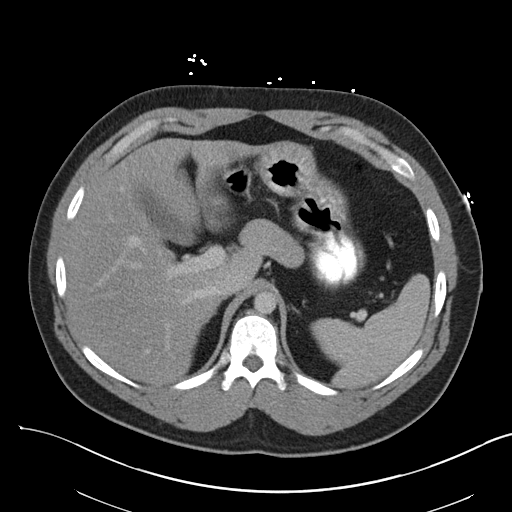
[im 80/95  lung]
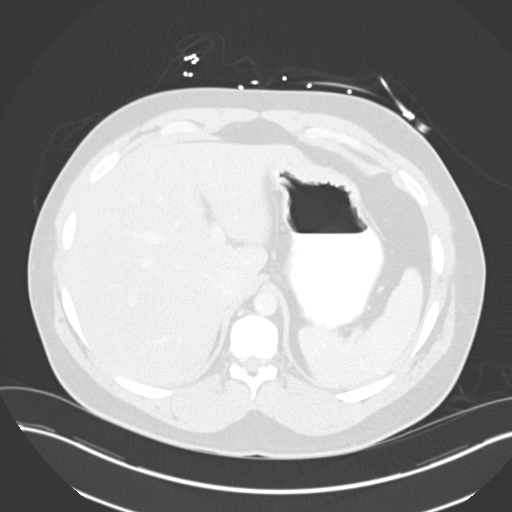
[im 84/95  soft-tissue]
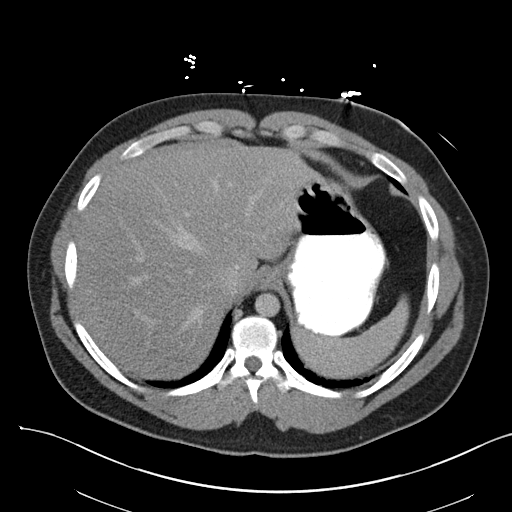
[im 84/95  lung]
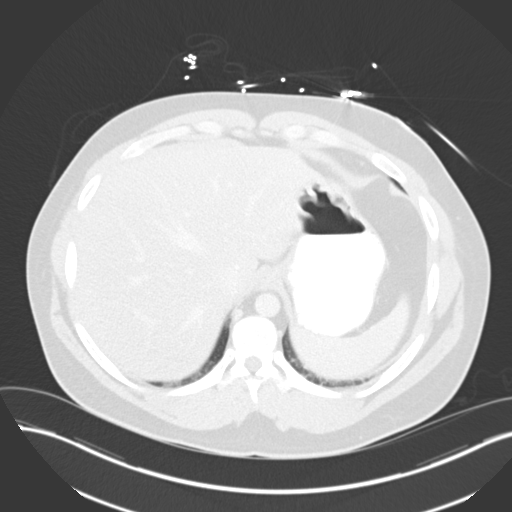
[im 87/95  lung]
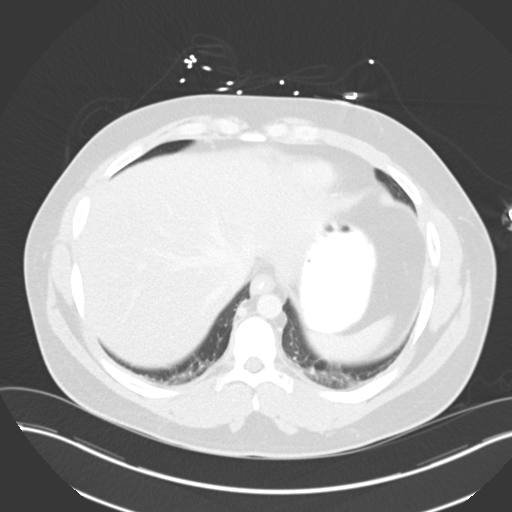
[im 91/95  soft-tissue]
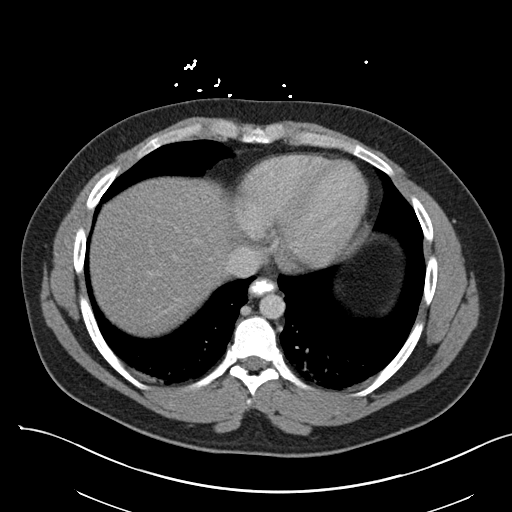
[im 91/95  lung]
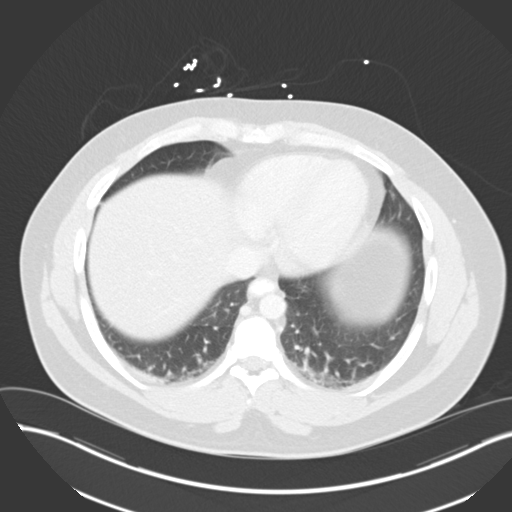

[15 of 32 positions shown; findings below may reference images not displayed]

FINDINGS: Small amount of oral contrast material in distal esophagus probable
from gastroesophageal reflux.

Lung bases are unremarkable. Sagittal images of the spine are
unremarkable.

Liver, spleen, pancreas and adrenals are unremarkable. No aortic
aneurysm. No calcified gallstones are noted within gallbladder.
Kidneys are symmetrical in size and enhancement. No hydronephrosis
or hydroureter.

There is abnormal enlargement and enhancement of the appendix
appendix measures 1.3 cm in diameter in axial image 52. There is
stranding of surrounding fat as trace periappendiceal fluid.
Findings are consistent with acute appendicitis.

The urinary bladder is unremarkable. Prostate gland and seminal
vesicles are unremarkable. No distal colonic obstruction. No
inguinal adenopathy.
IMPRESSION: 1. There is abnormal enlargement of the appendix with stranding of
surrounding fat and small amount of periappendiceal fluid. Findings
are consistent with acute appendicitis.
2. No small bowel obstruction.
3. No hydronephrosis or hydroureter.
Critical Value/emergent results were called by telephone at the time
of interpretation on 08/11/2013 at [DATE] to Dr.JUHOMATTI FLYKTMAN , who
verbally acknowledged these results.

## 2014-12-21 ENCOUNTER — Ambulatory Visit (INDEPENDENT_AMBULATORY_CARE_PROVIDER_SITE_OTHER): Payer: Self-pay | Admitting: Physician Assistant

## 2014-12-21 VITALS — BP 118/70 | HR 80 | Temp 98.1°F | Resp 12 | Ht 68.0 in | Wt 253.0 lb

## 2014-12-21 DIAGNOSIS — J4541 Moderate persistent asthma with (acute) exacerbation: Secondary | ICD-10-CM

## 2014-12-21 DIAGNOSIS — J302 Other seasonal allergic rhinitis: Secondary | ICD-10-CM

## 2014-12-21 MED ORDER — MONTELUKAST SODIUM 10 MG PO TABS: 10.0000 mg | ORAL_TABLET | Freq: Every day | ORAL | Status: DC

## 2014-12-21 MED ORDER — FLUTICASONE PROPIONATE 50 MCG/ACT NA SUSP: 2.0000 | Freq: Every day | NASAL | Status: DC

## 2014-12-21 MED ORDER — ALBUTEROL SULFATE HFA 108 (90 BASE) MCG/ACT IN AERS: 2.0000 | INHALATION_SPRAY | Freq: Four times a day (QID) | RESPIRATORY_TRACT | Status: DC | PRN

## 2014-12-21 MED ORDER — ALBUTEROL SULFATE (2.5 MG/3ML) 0.083% IN NEBU
2.5000 mg | INHALATION_SOLUTION | Freq: Once | RESPIRATORY_TRACT | Status: AC
  Administered 2014-12-21: 2.5 mg via RESPIRATORY_TRACT

## 2014-12-21 NOTE — Progress Notes (Signed)
Subjective:    Patient ID: Nicholas Sanford, male    DOB: [birth year], 29 y.o.   MRN: [id number]  HPI Pt presents to clinic for a refill of his albuterol - he states for the last 2 weeks he has been unable to breath and he has been out of his albuterol.  He takes his asthma medication daily (it has been free with a coupon that he has).  He has nasal congestion and rhinorrhea but his SOB and wheezing is really bad and seems to be getting worse.  He worse in a kitchen.  He has been using his albuterol 3-4 times a day.  Review of Systems  Constitutional: Negative for fever and chills.  HENT: Positive for congestion and rhinorrhea.   Respiratory: Positive for shortness of breath and wheezing.   Allergic/Immunologic: Positive for environmental allergies.    Patient Active Problem List   Diagnosis Date Noted  . Allergic 12/16/2011  . Asthma 10/09/2011   Prior to Admission medications   Medication Sig Start Date End Date Taking? Authorizing Provider  albuterol (PROVENTIL HFA;VENTOLIN HFA) 108 (90 BASE) MCG/ACT inhaler Inhale 2 puffs into the lungs every 6 (six) hours as needed for wheezing or shortness of breath.   Yes Historical Provider, MD  cetirizine (ZYRTEC) 10 MG tablet Take 1 tablet (10 mg total) by mouth daily. 12/16/11  Yes Orma Flaming, MD  Fluticasone Furoate-Vilanterol (BREO ELLIPTA) 200-25 MCG/INH AEPB Inhale 1 Inhaler into the lungs daily. 04/20/14  Yes Robyn Haber, MD  ibuprofen (ADVIL,MOTRIN) 200 MG tablet You can take 2-3 tablets every 6 hours for pain. 08/12/13  Yes Earnstine Regal, PA-C   Allergies  Allergen Reactions  . Penicillins Itching   Medications, allergies, past medical history, surgical history, family history, social history and problem list reviewed and updated.     Objective:   Physical Exam  Constitutional: He is oriented to person, place, and time. He appears well-developed and well-nourished.  BP 118/70 mmHg  Pulse 80  Temp(Src) 98.1 F (36.7 C)  (Oral)  Resp 12  Ht 5\' 8"  (1.727 m)  Wt 253 lb (114.76 kg)  BMI 38.48 kg/m2  SpO2 93%  PF 350 L/min   HENT:  Head: Normocephalic and atraumatic.  Right Ear: Hearing, tympanic membrane, external ear and ear canal normal.  Left Ear: Hearing, tympanic membrane, external ear and ear canal normal.  Nose: Mucosal edema (pale and very swollen) present.  Mouth/Throat: Uvula is midline, oropharynx is clear and moist and mucous membranes are normal.  Eyes: Conjunctivae are normal.  Neck: Normal range of motion.  Cardiovascular: Normal rate, regular rhythm and normal heart sounds.   No murmur heard. Pulmonary/Chest: Effort normal. No respiratory distress. He has wheezes (inspiratory  (at the end) << expiratory wheezing (throughout)). He has no rales.  Neurological: He is alert and oriented to person, place, and time.  Skin: Skin is warm and dry.  Psychiatric: He has a normal mood and affect. His behavior is normal. Judgment and thought content normal.   The albuterol neb helped - he feels much better.  The inspiratory wheezing resolved and there was only scant end expiratory wheezing.    Assessment & Plan:  Asthma, moderate persistent, with acute exacerbation - Plan: albuterol (PROVENTIL) (2.5 MG/3ML) 0.083% nebulizer solution 2.5 mg, montelukast (SINGULAIR) 10 MG tablet, albuterol (PROVENTIL HFA;VENTOLIN HFA) 108 (90 BASE) MCG/ACT inhaler  Seasonal allergies - Plan: montelukast (SINGULAIR) 10 MG tablet, fluticasone (FLONASE) 50 MCG/ACT nasal spray   We discussed that his  allergies are not controlled which are most likely triggering his asthma.  He is also not controlled with his asthma due to his use of albuterol.  We are going to add Flonase and singulair in hopes to better control his allergies.  [facility] - he will f/u with [place] (or them) in 2 months to see if his control is better.  He will RTC if his use of albuterol continues  to be high in the next month.  Windell Hummingbird PA-C  [facility] 12/21/2014 1:40 PM

## 2014-12-21 NOTE — Patient Instructions (Signed)
Our goal is to have you use your albuterol less than 2 times per week during the day - this means your asthma is controlled.  We are going to add some allergy medication to your daily medications to see if we can get your allergies more controlled which will also help your asthma.

## 2014-12-22 ENCOUNTER — Ambulatory Visit: Payer: Medicaid Other

## 2014-12-23 ENCOUNTER — Ambulatory Visit: Payer: Medicaid Other | Attending: Internal Medicine

## 2015-02-23 ENCOUNTER — Ambulatory Visit: Payer: Medicaid Other | Attending: Family Medicine | Admitting: Family Medicine

## 2015-02-23 ENCOUNTER — Encounter: Payer: Self-pay | Admitting: Family Medicine

## 2015-02-23 VITALS — BP 113/71 | HR 92 | Temp 98.5°F | Resp 16 | Ht 68.0 in | Wt 257.0 lb

## 2015-02-23 DIAGNOSIS — R0689 Other abnormalities of breathing: Secondary | ICD-10-CM

## 2015-02-23 DIAGNOSIS — J4531 Mild persistent asthma with (acute) exacerbation: Secondary | ICD-10-CM

## 2015-02-23 DIAGNOSIS — Z789 Other specified health status: Secondary | ICD-10-CM

## 2015-02-23 DIAGNOSIS — J45909 Unspecified asthma, uncomplicated: Secondary | ICD-10-CM | POA: Insufficient documentation

## 2015-02-23 DIAGNOSIS — E669 Obesity, unspecified: Secondary | ICD-10-CM

## 2015-02-23 DIAGNOSIS — R0602 Shortness of breath: Secondary | ICD-10-CM

## 2015-02-23 DIAGNOSIS — J302 Other seasonal allergic rhinitis: Secondary | ICD-10-CM

## 2015-02-23 DIAGNOSIS — J4541 Moderate persistent asthma with (acute) exacerbation: Secondary | ICD-10-CM | POA: Diagnosis not present

## 2015-02-23 MED ORDER — MONTELUKAST SODIUM 10 MG PO TABS: 10.0000 mg | ORAL_TABLET | Freq: Every day | ORAL | Status: DC

## 2015-02-23 MED ORDER — CETIRIZINE HCL 10 MG PO TABS: 10.0000 mg | ORAL_TABLET | Freq: Every day | ORAL | Status: DC

## 2015-02-23 MED ORDER — ALBUTEROL SULFATE HFA 108 (90 BASE) MCG/ACT IN AERS: 2.0000 | INHALATION_SPRAY | Freq: Four times a day (QID) | RESPIRATORY_TRACT | Status: DC | PRN

## 2015-02-23 NOTE — Patient Instructions (Signed)
Mr. Haven,  Thank you for coming in today. It was a pleasure meeting you. I look forward to being your primary doctor.  1. Asthma: Ordered zyrtec, flonase and singulair Continue daily breo Continue albuterol as needed Avoid being outside between 12-4 PM which is the hottest time of day   F/u in 3-4 weeks for asthma control visit   Dr. Adrian Blackwater

## 2015-02-23 NOTE — Progress Notes (Signed)
   Subjective:    Patient ID: Nicholas Sanford, male    DOB: Nov 21, 1985, 29 y.o.   MRN: [id number] CC: establish care, asthma  Spanish interpreter present  HPI   1. Asthma: having symptoms most of the time. SOB 3-6 times per week. Has nighttime symptoms 4 or more nights a week. Using albuterol 1-2 times per week. Reports asthma is somewhat controlled. Taking [employer], has a coupon for year supply. Taking zyrtec OTC. Not using flonase or Singulair.   Soc Hx: non smoker  Med Hx: asthma dx in childhood Surg Hx: s/p appendectomy  Review of Systems  Constitutional: Negative for fever and chills.  Respiratory: Positive for shortness of breath and wheezing. Negative for cough.   Cardiovascular: Negative for chest pain, palpitations and leg swelling.  Neurological: Negative for dizziness and weakness.      Objective:   Physical Exam BP 113/71 mmHg  Pulse 92  Temp(Src) 98.5 F (36.9 C) (Oral)  Resp 16  Ht 5\' 8"  (1.727 m)  Wt 257 lb (116.574 kg)  BMI 39.09 kg/m2  SpO2 95% General appearance: alert, cooperative and no distress Nose: no discharge, turbinates pink, swollen Throat: normal findings: lips normal without lesions, gums healthy and teeth intact, non-carious and abnormal findings: tonsillar hypertrophy 2+ Neck: no adenopathy, supple, symmetrical, trachea midline and thyroid not enlarged, symmetric, no tenderness/mass/nodules Lungs: normal WOB, rare exp wheezing  Heart: regular rate and rhythm, S1, S2 normal, no murmur, click, rub or gallop Extremities: extremities normal, atraumatic, no cyanosis or edema     Assessment & Plan:

## 2015-02-23 NOTE — Assessment & Plan Note (Signed)
A: Asthma: not welll controlled. Without acute exacerbation  P: Ordered zyrtec, flonase and singulair Continue daily breo Continue albuterol as needed Avoid being outside between 12-4 PM which is the hottest time of day   F/u in 3-4 weeks for asthma control visit

## 2015-02-23 NOTE — Progress Notes (Signed)
Establish Care Hx Asthma  No Tobacco user

## 2015-03-30 ENCOUNTER — Ambulatory Visit: Payer: Medicaid Other | Attending: Family Medicine | Admitting: Family Medicine

## 2015-03-30 ENCOUNTER — Encounter: Payer: Self-pay | Admitting: Family Medicine

## 2015-03-30 VITALS — BP 113/70 | HR 106 | Temp 99.1°F | Resp 18 | Ht 68.0 in | Wt 257.0 lb

## 2015-03-30 DIAGNOSIS — J4530 Mild persistent asthma, uncomplicated: Secondary | ICD-10-CM

## 2015-03-30 DIAGNOSIS — M25522 Pain in left elbow: Secondary | ICD-10-CM | POA: Insufficient documentation

## 2015-03-30 DIAGNOSIS — J45909 Unspecified asthma, uncomplicated: Secondary | ICD-10-CM | POA: Insufficient documentation

## 2015-03-30 DIAGNOSIS — M25562 Pain in left knee: Secondary | ICD-10-CM | POA: Insufficient documentation

## 2015-03-30 NOTE — Progress Notes (Signed)
F/U Asthma  Stated join pain possible from allergie medication

## 2015-03-30 NOTE — Patient Instructions (Signed)
Mr. Schnoor,  Thank you for coming in today  1. Asthma: Improved Continue current regimen Is is safe and recommended that you use albuterol for wheezing or chest tightness symptoms   F/u in 4-6 weeks with RN for flu shot F/u with me in 3 months   Dr. Adrian Blackwater

## 2015-03-30 NOTE — Assessment & Plan Note (Addendum)
A: improved control. Breathing treatment today due to wheezing P: Continue current regimen Advised patient use prn albuterol

## 2015-03-30 NOTE — Progress Notes (Signed)
   Subjective:    Patient ID: Nicholas Sanford, male    DOB: 1986-07-31, 29 y.o.   MRN: [id number] CC: f/u asthma Spanish interpreter present  HPI 29 yo M presents for f/u visit:  1. Asthma: improved. Compliant with breo, flonase, zyrtec and singulair. Not using albuterol. Patient reports he is trying not to use albuterol   2. Joint pain: L elbow and L knee pain. No swelling. No injury. Pain is intermittent.   Soc Hx: non smoker  Review of Systems  Constitutional: Negative for fever and chills.  Respiratory: Positive for shortness of breath and wheezing.   Cardiovascular: Negative for chest pain.  Musculoskeletal: Positive for arthralgias.      Objective:   Physical Exam BP 113/70 mmHg  Pulse 106  Temp(Src) 99.1 F (37.3 C) (Oral)  Resp 18  Ht 5\' 8"  (1.727 m)  Wt 257 lb (116.574 kg)  BMI 39.09 kg/m2  SpO2 96% General appearance: alert, cooperative and no distress Lungs: normal WOB, scattered exp wheezing bilaterally  Heart: regular rate and rhythm, S1, S2 normal, no murmur, click, rub or gallop Extremities: extremities normal, atraumatic, no cyanosis or edema   Albuterol treatment x one given     Assessment & Plan:

## 2015-04-21 ENCOUNTER — Other Ambulatory Visit: Payer: Self-pay | Admitting: Family Medicine

## 2015-05-06 ENCOUNTER — Emergency Department (HOSPITAL_COMMUNITY)
Admission: EM | Admit: 2015-05-06 | Discharge: 2015-05-07 | Disposition: A | Discharge status: Home / Self Care | Payer: Medicaid Other | Attending: Emergency Medicine | Admitting: Emergency Medicine

## 2015-05-06 ENCOUNTER — Encounter (HOSPITAL_COMMUNITY): Payer: Self-pay | Admitting: *Deleted

## 2015-05-06 ENCOUNTER — Emergency Department (HOSPITAL_COMMUNITY): Payer: Medicaid Other

## 2015-05-06 DIAGNOSIS — J4552 Severe persistent asthma with status asthmaticus: Secondary | ICD-10-CM

## 2015-05-06 DIAGNOSIS — J45901 Unspecified asthma with (acute) exacerbation: Secondary | ICD-10-CM | POA: Insufficient documentation

## 2015-05-06 DIAGNOSIS — Z79899 Other long term (current) drug therapy: Secondary | ICD-10-CM | POA: Insufficient documentation

## 2015-05-06 DIAGNOSIS — Z7951 Long term (current) use of inhaled steroids: Secondary | ICD-10-CM | POA: Insufficient documentation

## 2015-05-06 DIAGNOSIS — Z880 Allergy status to penicillin: Secondary | ICD-10-CM | POA: Insufficient documentation

## 2015-05-06 MED ORDER — PREDNISONE 20 MG PO TABS
60.0000 mg | ORAL_TABLET | Freq: Once | ORAL | Status: AC
  Administered 2015-05-06: 60 mg via ORAL
  Filled 2015-05-06: qty 3

## 2015-05-06 MED ORDER — ALBUTEROL SULFATE (2.5 MG/3ML) 0.083% IN NEBU
5.0000 mg | INHALATION_SOLUTION | Freq: Once | RESPIRATORY_TRACT | Status: AC
  Administered 2015-05-07: 5 mg via RESPIRATORY_TRACT
  Filled 2015-05-06: qty 6

## 2015-05-06 MED ORDER — IPRATROPIUM BROMIDE 0.02 % IN SOLN
0.5000 mg | Freq: Once | RESPIRATORY_TRACT | Status: AC
  Administered 2015-05-07: 0.5 mg via RESPIRATORY_TRACT
  Filled 2015-05-06: qty 2.5

## 2015-05-06 MED ORDER — IPRATROPIUM-ALBUTEROL 0.5-2.5 (3) MG/3ML IN SOLN
RESPIRATORY_TRACT | Status: AC
  Administered 2015-05-06: 3 mL
  Filled 2015-05-06: qty 3

## 2015-05-06 NOTE — ED Notes (Signed)
Patient presents with audible wheezing.  Stated he has been using his inhaler but it has not helped.

## 2015-05-06 NOTE — ED Provider Notes (Signed)
CSN: [id number]     Arrival date & time 05/06/15  2301 History   First MD Initiated Contact with Patient 05/06/15 2340     Chief Complaint  Patient presents with  . Asthma     (Consider location/radiation/quality/duration/timing/severity/associated sxs/prior Treatment) HPI Comments:  29 year old male with a history of asthma and seasonal allergies presents to the emergency department for evaluation of wheezing. Patient states that his symptoms have been waxing and waning and worsening over the last 24 hours. Patient states that he has been using his albuterol inhaler, but this has not been helping him. Patient tried to call and make an appointment with his primary care doctor, but they were unable to see him until 2 weeks from today. Patient reports a feeling of shortness of breath as well as chest tightness. He denies chest pain, fever, nausea, or vomiting. He states that his symptoms feel consistent with his prior asthma exacerbations.  Patient is a 29 y.o. male presenting with asthma. The history is provided by the patient. No language interpreter was used.  Asthma Associated symptoms include congestion. Pertinent negatives include no chest pain, fever, nausea or vomiting.    Past Medical History  Diagnosis Date  . Allergy     sesonal   . Asthma     as a child    Past Surgical History  Procedure Laterality Date  . Laparoscopic appendectomy N/A 08/11/2013    Procedure: APPENDECTOMY LAPAROSCOPIC;  Surgeon: Shann Medal, MD;  Location: [facility];  Service: General;  Laterality: N/A;  . Appendectomy  2014   No family history on file. Social History  Substance Use Topics  . Smoking status: Never Smoker   . Smokeless tobacco: Never Used  . Alcohol Use: No    Review of Systems  Constitutional: Negative for fever.  HENT: Positive for congestion.   Respiratory: Positive for chest tightness, shortness of breath and wheezing.   Cardiovascular: Negative for chest pain.   Gastrointestinal: Negative for nausea and vomiting.  All other systems reviewed and are negative.   Allergies  Penicillins  Home Medications   Prior to Admission medications   Medication Sig Start Date End Date Taking? Authorizing Provider  albuterol (PROVENTIL HFA;VENTOLIN HFA) 108 (90 BASE) MCG/ACT inhaler Inhale 2 puffs into the lungs every 6 (six) hours as needed for wheezing or shortness of breath. 02/23/15   Boykin Nearing, MD  cetirizine (ZYRTEC) 10 MG tablet Take 1 tablet (10 mg total) by mouth daily. 02/23/15   Josalyn Funches, MD  fluticasone (FLONASE) 50 MCG/ACT nasal spray Place 2 sprays into both nostrils daily. 12/21/14   Mancel Bale, PA-C  Fluticasone Furoate-Vilanterol (BREO ELLIPTA) 200-25 MCG/INH AEPB Inhale 1 Inhaler into the lungs daily. 04/20/14   Robyn Haber, MD  montelukast (SINGULAIR) 10 MG tablet Take 1 tablet (10 mg total) by mouth at bedtime. 02/23/15   Josalyn Funches, MD   BP 151/88 mmHg  Pulse 98  Temp(Src) 98.3 F (36.8 C) (Oral)  Resp 24  Wt 256 lb 3.2 oz (116.212 kg)  SpO2 92%   Physical Exam  Constitutional: He is oriented to person, place, and time. He appears well-developed and well-nourished. No distress.  Nontoxic/nonseptic appearing. Patient speaking in full sentences.  HENT:  Head: Normocephalic and atraumatic.  Eyes: Conjunctivae and EOM are normal. No scleral icterus.  Neck: Normal range of motion.  Cardiovascular: Normal rate, regular rhythm and intact distal pulses.   Pulmonary/Chest: Effort normal. No respiratory distress. He has wheezes. He has no rales.  No accessory muscle use. Expiratory wheezing heard diffusely, worse in bilateral bases. No rales or rhonchi appreciated.  Musculoskeletal: Normal range of motion.  Neurological: He is alert and oriented to person, place, and time. He exhibits normal muscle tone. Coordination normal.  GCS 15. Patient moves all extremities.  Skin: Skin is warm and dry. No rash noted. He is not  diaphoretic. No erythema. No pallor.  Psychiatric: He has a normal mood and affect. His behavior is normal.  Nursing note and vitals reviewed.   ED Course  Procedures (including critical care time) Labs Review Labs Reviewed - No data to display  Imaging Review Dg Chest 2 View  05/06/2015   CLINICAL DATA:  Shortness of breath, cough, wheezing, asthma  EXAM: CHEST  2 VIEW  COMPARISON:  None.  FINDINGS: The heart size and mediastinal contours are within normal limits. Both lungs are clear. The visualized skeletal structures are unremarkable. Calcified right upper lobe granuloma reidentified.  IMPRESSION: No active cardiopulmonary disease.   Electronically Signed   By: Conchita Paris M.D.   On: 05/06/2015 23:50   I have personally reviewed and evaluated these images and lab results as part of my medical decision-making.   EKG Interpretation None      0312 - I ambulated the patient on room air. O2 sats never fell below 91%. He averaged at 93% with ambulation and no c/o SOB. No tachypnea or dyspnea. Lungs CTAB on reexamination. Patient has been off a continuous albuterol treatment for approximately 1 hour. MDM   Final diagnoses:  Asthma exacerbation    Patient presents to the emergency department for symptoms consistent with asthma exacerbation. Patient given DuoNeb 2 in the emergency department as well as continuous albuterol treatment and oral sterile right. Patient without hypoxia at rest. He states that he is feeling much better. Lungs are clear after completion of DuoNeb. Patient has no complaints of shortness of breath at this time. He ambulates without hypoxia. Do not believe further emergent workup is indicated. Will discharge with 5 day course of oral steroids as well as an albuterol inhaler and prescription for albuterol nebulizer solution. Return precautions given at discharge. Patient discharged in good condition with no unaddressed concerns.   Filed Vitals:   05/07/15 0200  05/07/15 0233 05/07/15 0234 05/07/15 0300  BP: 128/69 130/75  99/68  Pulse: 87  98 92  Temp:      TempSrc:      Resp:      Weight:      SpO2: 95%  90% 90%     Antonietta Breach, PA-C 05/07/15 5883  Varney Biles, MD 05/08/15 [phone]

## 2015-05-07 MED ORDER — PREDNISONE 20 MG PO TABS: 60.0000 mg | ORAL_TABLET | Freq: Once | ORAL | Status: DC

## 2015-05-07 MED ORDER — ALBUTEROL SULFATE HFA 108 (90 BASE) MCG/ACT IN AERS
2.0000 | INHALATION_SPRAY | Freq: Once | RESPIRATORY_TRACT | Status: AC
  Administered 2015-05-07: 2 via RESPIRATORY_TRACT
  Filled 2015-05-07: qty 6.7

## 2015-05-07 MED ORDER — ALBUTEROL SULFATE (2.5 MG/3ML) 0.083% IN NEBU: 2.5000 mg | INHALATION_SOLUTION | RESPIRATORY_TRACT | Status: DC | PRN

## 2015-05-07 MED ORDER — ALBUTEROL (5 MG/ML) CONTINUOUS INHALATION SOLN
10.0000 mg/h | INHALATION_SOLUTION | Freq: Once | RESPIRATORY_TRACT | Status: AC
  Administered 2015-05-07: 10 mg/h via RESPIRATORY_TRACT
  Filled 2015-05-07: qty 20

## 2015-05-07 NOTE — ED Notes (Signed)
Pt was ambulated around nurses station with steady gait with portable pulse ox; initial pulse ox was 90% on room air; ending pulse ox was 89% on room air; pt sts his breathing has improved since arrival to ER; provider notified

## 2015-05-07 NOTE — Discharge Instructions (Signed)
Asma, broncoespasmo agudo °(Asthma, Acute Bronchospasm) °El broncoespasmo agudo causado por el asma también se conoce como crisis de asma. Broncoespasmo significa que las vías respiratorias se han estrechado. La causa del estrechamiento es la inflamación y la constricción de los músculos de las vías respiratorias (bronquios) que se encuentran en los pulmones. Esto puede dificultar la respiración o provocarle sibilancias y tos. °CAUSAS °Los desencadenantes posibles son: °· La caspa que eliminan los animales de la piel, el pelo o las plumas de los animales. °· Los ácaros que se encuentran en el polvo de la casa. °· Cucar[place]as. °· El polen de los árboles o el césped. °· Moho. °· El humo del cigarrillo o del tabaco °· Sustancias contaminantes como el polvo, limpiadores hogareños, aerosoles (como los aerosoles para el cabello), vapores de pintura, sustancias químicas fuertes u olores intensos. °· El aire frío o cambios climáticos. El aire frío puede causar inflamación. El viento aumenta la cantidad de moho y polen del aire. °· Emociones fuertes, como llorar o reír intensamente. °· Estrés. °· Ciertos medicamentos como la aspirina o betabloqueantes. °· Los sulfitos que se encuentran en las comidas y bebidas como frutas secas y el vino. °· Enfermedades infecciosas o inflamatorias, como la gripe, el resfrío o la inflamación de las membranas nasales (rinitis). °· El reflujo gastroesofágico (ERGE). El reflujo gastroesofágico es una afección en la que los ácidos estomacales vuelven al esófago. °· Los ejercicios o actividades extenuantes. °SIGNOS Y SÍNTOMAS  °· Sibilancias. °· Tos intensa, especialmente por la noche. °· Opresión en el pecho. °· Falta de aire. °DIAGNÓSTICO  °El médico le hará una historia clínica y le hará un examen físico. Le indicarán radiografías o análisis de sangre para buscar otras causas de los síntomas u otras enfermedades que puedan desencadenar una crisis de asma.  °TRATAMIENTO  °El tratamiento está  dirigido a reducir la inflamación y abrir las vías respiratorias en los pulmones. La mayor parte de las crisis asmáticas se tratan con medicamentos por vía inhalatoria. Entre ellos se incluyen los medicamentos de alivio rápido o medicamentos de rescate (como los broncodilatadores) y los medicamentos de control (como los corticoides inhalados). Estos medicamentos se administran a través de un inhalador o de un nebulizador. Los corticoides sistémicos por vía oral o por vía intravenosa también se administran para reducir la inflamación cuando un ataque es moderado o grave. Los antibióticos se indican solo si hay infección bacteriana.  °INSTRUCCIONES PARA EL CUIDADO EN EL HOGAR  °· Reposo. °· Beba líquido en abundancia. Esto ayuda a diluir la mucosidad y a eliminarla fácilmente. Solo consuma productos con cafeína moderadamente y no consuma alcohol hasta que se haya recuperado de la enfermedad. °· No fume. Evite la exposición al humo de otros fumadores. °· Usted tiene un rol fundamental en mantener su buena salud. Evite la exposición a lo que le ocasiona los problemas respiratorios. °· Mantenga los medicamentos actualizados y al alcance. Siga cuidadosamente el plan de tratamiento del médico. °· Utilice los medicamentos tal como se le indicó. °· Cuando haya mucho polen o polución, mantenga las ventanas cerradas y use el aire acondicionado o vaya a lugares con aire acondicionado. °· El asma requiere atención médica exhaustiva. Concurra a los controles según las indicaciones. Si tiene un embarazo de más de 24 semanas y le han recetado medicamentos nuevos, coméntelo con su obstetra y cuál es su evolución. Concurra a las consultas de control con su médico según las indicaciones. °· Después de recuperarse de la crisis de asma, haga una cita con   el mdico para conocer cmo puede reducir la probabilidad de futuros ataques. Si no cuenta con un mdico para que controle su asma, haga una cita con un mdico de atencin primaria para  hablar de esta enfermedad. [place] DE INMEDIATO SI:   Empeora.  Tiene dificultad para respirar. Si la dificultad es intensa comunquese con el servicio de Multimedia programmer de su localidad (911 en los [place]).  Siente dolor o Adult nurse.  Tiene vmitos.  No puede retener los lquidos.  Elimina una expectoracin verde, amarilla, amarronada o sanguinolenta.  Tiene fiebre y los sntomas empeoran repentinamente.  Presenta dificultad para tragar. ASEGRESE DE QUE:   Comprende estas instrucciones.  Controlar su afeccin.  Recibir ayuda de inmediato si no mejora o si empeora. Document Released: 12/08/2008 Document Revised: 08/27/2013 [facility] Patient Information 2015 [place]. This information is not intended to replace advice given to you by your health care provider. Make sure you discuss any questions you have with your health care provider.

## 2015-05-07 NOTE — ED Notes (Signed)
Pt ambulated around nurses station with steady gait; initial pulse ox was 90% on room air and ending pulse ox was 90% on room air; pt sts he feels much better after duoneb; MD notified

## 2015-05-07 NOTE — ED Notes (Signed)
Pt ambulated with PA with O2 sats averaging at 93% RA; Pt shows no sign of Respiratory distress

## 2015-05-07 NOTE — ED Notes (Signed)
Pt ambulated around nurses station with steady gait; pts initial pulse ox was 95% on room air; ending pulse ox was 89% on room air; pt sts he feels better after hr long neb treatment; pt does not appear to be in any resp distress; minor wheezes still present; pt back in bed with oxygen monitor; [employer] notified

## 2015-05-26 ENCOUNTER — Ambulatory Visit: Payer: Medicaid Other | Attending: Family Medicine | Admitting: Family Medicine

## 2015-05-26 ENCOUNTER — Encounter: Payer: Self-pay | Admitting: Family Medicine

## 2015-05-26 VITALS — BP 119/75 | HR 75 | Temp 98.3°F | Resp 16 | Ht 66.0 in | Wt 259.0 lb

## 2015-05-26 DIAGNOSIS — J4550 Severe persistent asthma, uncomplicated: Secondary | ICD-10-CM | POA: Diagnosis not present

## 2015-05-26 DIAGNOSIS — K088 Other specified disorders of teeth and supporting structures: Secondary | ICD-10-CM | POA: Diagnosis not present

## 2015-05-26 DIAGNOSIS — K089 Disorder of teeth and supporting structures, unspecified: Secondary | ICD-10-CM | POA: Insufficient documentation

## 2015-05-26 DIAGNOSIS — Z79899 Other long term (current) drug therapy: Secondary | ICD-10-CM | POA: Insufficient documentation

## 2015-05-26 DIAGNOSIS — J302 Other seasonal allergic rhinitis: Secondary | ICD-10-CM | POA: Diagnosis not present

## 2015-05-26 DIAGNOSIS — Z7952 Long term (current) use of systemic steroids: Secondary | ICD-10-CM | POA: Insufficient documentation

## 2015-05-26 MED ORDER — FLUTICASONE FUROATE-VILANTEROL 100-25 MCG/INH IN AEPB: 1.0000 | INHALATION_SPRAY | Freq: Two times a day (BID) | RESPIRATORY_TRACT | Status: DC

## 2015-05-26 MED ORDER — MONTELUKAST SODIUM 10 MG PO TABS: 10.0000 mg | ORAL_TABLET | Freq: Every day | ORAL | Status: DC

## 2015-05-26 MED ORDER — FLUTICASONE PROPIONATE 50 MCG/ACT NA SUSP: 2.0000 | Freq: Every day | NASAL | Status: DC

## 2015-05-26 MED ORDER — ALBUTEROL SULFATE HFA 108 (90 BASE) MCG/ACT IN AERS: 2.0000 | INHALATION_SPRAY | Freq: Four times a day (QID) | RESPIRATORY_TRACT | Status: DC | PRN

## 2015-05-26 MED ORDER — CETIRIZINE HCL 10 MG PO TABS: 10.0000 mg | ORAL_TABLET | Freq: Every day | ORAL | Status: DC

## 2015-05-26 NOTE — Progress Notes (Signed)
Patient ID: Nicholas Sanford, male   DOB: 15-Sep-1985, 29 y.o.   MRN: [id number]   Subjective:  Patient ID: Nicholas Sanford, male    DOB: [birth year]  Age: 29 y.o. MRN: [id number]  CC: Asthma   HPI Nicholas Sanford presents for asthma   1. Asthma: worsening SOB. Out of breo for 25 days. Had a one year medication assistance. SOB and DOE. Went to UC on 05/05/14. Treated with 5 day course of prednisone. Last used albuterol 30 minutes ago. Symptoms interfere with quality of life all of the time. SOB multiple times a day. Symptoms 4 + nights per wee. Using albuterol 3+ times per day. Symptoms are not controlled.   Outpatient Prescriptions Prior to Visit  Medication Sig Dispense Refill  . albuterol (PROVENTIL HFA;VENTOLIN HFA) 108 (90 BASE) MCG/ACT inhaler Inhale 2 puffs into the lungs every 6 (six) hours as needed for wheezing or shortness of breath. 1 Inhaler 5  . albuterol (PROVENTIL) (2.5 MG/3ML) 0.083% nebulizer solution Take 3 mLs (2.5 mg total) by nebulization every 4 (four) hours as needed for wheezing or shortness of breath. 75 mL 12  . cetirizine (ZYRTEC) 10 MG tablet Take 1 tablet (10 mg total) by mouth daily. 30 tablet 5  . fluticasone (FLONASE) 50 MCG/ACT nasal spray Place 2 sprays into both nostrils daily. 16 g 2  . Fluticasone Furoate-Vilanterol (BREO ELLIPTA) 200-25 MCG/INH AEPB Inhale 1 Inhaler into the lungs daily. 1 each 11  . montelukast (SINGULAIR) 10 MG tablet Take 1 tablet (10 mg total) by mouth at bedtime. 30 tablet 5  . predniSONE (DELTASONE) 20 MG tablet Take 3 tablets (60 mg total) by mouth once. 15 tablet 0   No facility-administered medications prior to visit.   Social History  Substance Use Topics  . Smoking status: Never Smoker   . Smokeless tobacco: Never Used  . Alcohol Use: No   ROS Review of Systems  Constitutional: Negative for fever, chills, fatigue and unexpected weight change.  Eyes: Negative for visual disturbance.  Respiratory: Positive for  shortness of breath. Negative for cough and wheezing.   Cardiovascular: Negative for chest pain, palpitations and leg swelling.  Gastrointestinal: Negative for nausea, vomiting, abdominal pain, diarrhea, constipation and blood in stool.  Endocrine: Negative for polydipsia, polyphagia and polyuria.  Musculoskeletal: Negative for myalgias, back pain, arthralgias, gait problem and neck pain.  Skin: Negative for rash.  Allergic/Immunologic: Negative for immunocompromised state.  Hematological: Negative for adenopathy. Does not bruise/bleed easily.  Psychiatric/Behavioral: Negative for suicidal ideas, sleep disturbance and dysphoric mood. The patient is not nervous/anxious.     Objective:  BP 119/75 mmHg  Pulse 75  Temp(Src) 98.3 F (36.8 C) (Oral)  Resp 16  Ht 5\' 6"  (1.676 m)  Wt 259 lb (117.482 kg)  BMI 41.82 kg/m2  SpO2 96%  PF 560 L/min  BP/Weight 05/26/2015 05/07/2015 2/75/1700  Systolic BP 174 99 -  Diastolic BP 75 68 -  Wt. (Lbs) 259 - 256.2  BMI 41.82 - 38.96   Physical Exam  Constitutional: He appears well-developed and well-nourished. No distress.  Obese   HENT:  Head: Normocephalic and atraumatic.  Neck: Normal range of motion. Neck supple.  Cardiovascular: Normal rate, regular rhythm, normal heart sounds and intact distal pulses.   Pulmonary/Chest: Effort normal. He has decreased breath sounds. He has no wheezes. He has no rales.  Musculoskeletal: He exhibits no edema.  Neurological: He is alert.  Skin: Skin is warm and dry. No rash noted. No erythema.  Psychiatric: He  has a normal mood and affect.    Assessment & Plan:   Problem List Items Addressed This Visit    Asthma, severe persistent, poorly-controlled - Primary (Chronic)    A: asthma control has declined off breo. PF 90 % predicted P: Called pharmacy breo in stock , 100-25 patient will take this breo BID pulm referral       Relevant Medications   Fluticasone Furoate-Vilanterol (BREO ELLIPTA) 100-25  MCG/INH AEPB   albuterol (PROVENTIL HFA;VENTOLIN HFA) 108 (90 BASE) MCG/ACT inhaler   fluticasone (FLONASE) 50 MCG/ACT nasal spray   montelukast (SINGULAIR) 10 MG tablet   Other Relevant Orders   Ambulatory referral to Pulmonology   Poor dentition   Relevant Orders   Ambulatory referral to Dentistry    Other Visit Diagnoses    Seasonal allergies        Relevant Medications    cetirizine (ZYRTEC) 10 MG tablet       No orders of the defined types were placed in this encounter.    Follow-up: Return in about 4 weeks (around 06/23/2015).   Boykin Nearing MD

## 2015-05-26 NOTE — Patient Instructions (Addendum)
[name],  Nicholas Sanford was seen today for asthma.  Diagnoses and all orders for this visit:  Asthma, severe persistent, poorly-controlled, uncomplicated -     Ambulatory referral to Pulmonology -     Fluticasone Furoate-Vilanterol (BREO ELLIPTA) 100-25 MCG/INH AEPB; Inhale 1 puff into the lungs 2 (two) times daily. -     albuterol (PROVENTIL HFA;VENTOLIN HFA) 108 (90 BASE) MCG/ACT inhaler; Inhale 2 puffs into the lungs every 6 (six) hours as needed for wheezing or shortness of breath. -     fluticasone (FLONASE) 50 MCG/ACT nasal spray; Place 2 sprays into both nostrils daily. -     montelukast (SINGULAIR) 10 MG tablet; Take 1 tablet (10 mg total) by mouth at bedtime.  Seasonal allergies -     cetirizine (ZYRTEC) 10 MG tablet; Take 1 tablet (10 mg total) by mouth daily.   Take breo 1 puff twice daily Referral to pulmonology  F/u with me in 4 weeks for asthma f/u and flu shot, bring peak flow meter  Dr. Adrian Blackwater

## 2015-05-26 NOTE — Assessment & Plan Note (Signed)
A: asthma control has declined off breo. PF 90 % predicted P: Called pharmacy breo in stock , 100-25 patient will take this breo BID pulm referral

## 2015-05-26 NOTE — Progress Notes (Signed)
C/C Asthma  Cough, wheezing x 28 days  No Hx tobacco

## 2015-06-01 ENCOUNTER — Telehealth: Payer: Self-pay | Admitting: Family Medicine

## 2015-06-01 NOTE — Telephone Encounter (Signed)
Error

## 2015-06-02 ENCOUNTER — Encounter: Payer: Self-pay | Admitting: Internal Medicine

## 2015-06-02 ENCOUNTER — Ambulatory Visit (INDEPENDENT_AMBULATORY_CARE_PROVIDER_SITE_OTHER): Payer: Self-pay | Admitting: Internal Medicine

## 2015-06-02 VITALS — BP 138/78 | HR 100 | Temp 99.2°F | Ht 65.0 in | Wt 258.0 lb

## 2015-06-02 DIAGNOSIS — J4550 Severe persistent asthma, uncomplicated: Secondary | ICD-10-CM

## 2015-06-02 NOTE — Patient Instructions (Addendum)
Start BREO one puff each am   Only use your albuterol as a rescue medication to be used if you can't catch your breath by resting or doing a relaxed purse lip breathing pattern.  - The less you use it, the better it will work when you need it. - Ok to use up to 2 puffs  every 4 hours if you must but call for immediate appointment if use goes up over your usual need - Don't leave home without it !!  (think of it like the spare tire for your car)    If you are satisfied with your treatment plan,  let your doctor know and he/she can either refill your medications or you can return here when your prescription runs out.     If in any way you are not 100% satisfied,  please tell us.  If 100% better, tell your friends!  Pulmonary follow up is as needed  - bring formulary with you if there is trouble affording this medication   -----------------------------------------------------------------------------------------  Breo comenzar una inhalacin cada am  Slo use su albuterol como medicacin de rescate que se utilizar si no se puede recuperar el aliento al descansar o hacer un patrn de respiracin del monedero de labios relajados. - Cuanto menos se use, mejor funcionar cuando lo necesite. - Ok para Risk manager un mximo de 2 inhalaciones cada 4 horas si es necesario, pero llame para una cita inmediata en caso de utilizacin sube por encima de su necesidad habitual - No salga de casa sin ella !! (Pensar en l como el neumtico de repuesto para su coche)   Si no est satisfecho con su plan de tratamiento, informe a su mdico y l / ella puede o bien rellenar sus medicamentos o puede regresar aqu cuando el medicamento se acaba.   Si en cualquier forma que no est 100% satisfecho, por favor dganos. Si el 100% mejor, dile a tus amigos!  Pulmonar seguimiento es, segn sea necesario - Llevar formulario con usted si hay problemas para poder pagar

## 2015-06-02 NOTE — Progress Notes (Signed)
   Subjective:    Patient ID: Nicholas Sanford, male    DOB: 1986-05-30,    MRN: [id number]  HPI  29 yo latino male in [place] since 2004 with asthma since childhood referred by Dr Adrian Blackwater to pulmonary clinic 06/03/2015 for dtc asthma    06/02/2015 1st [facility] office visit/ [facility]   Chief Complaint  Patient presents with  . Pulmonary Consult    referred by Dr. Adrian Blackwater for asthma - SOB and cough with green mucus x 1 month.  Symptoms improved while on prednisone but have started to worsen since finishing it.   he really has had difficult to control asthma since birth but finally found one product, BREO, that worked very well. The problem is one of obtaining the medication through his insurance. Over, when he was taking Brio he had no symptoms and no need for [facility] and since he stopped it the only product that seems to help and his prednisone.  No obvious other patterns in day to day or daytime variabilty or assoc  cp or chest tightness, subjective wheeze overt sinus or hb symptoms. No unusual exp hx or h/o childhood pna  or knowledge of premature birth.  Sleeping ok without nocturnal  or early am exacerbation  of respiratory  c/o's or need for noct saba. Also denies any obvious fluctuation of symptoms with weather or environmental changes or other aggravating or alleviating factors except as outlined above   Current Medications, Allergies, Complete Past Medical History, Past Surgical History, Family History, and Social History were reviewed in [employer] record.            Review of Systems  Constitutional: Negative for fever and unexpected weight change.  HENT: Positive for congestion, postnasal drip, sore throat and trouble swallowing. Negative for dental problem, ear pain, nosebleeds, rhinorrhea, sinus pressure and sneezing.   Eyes: Negative for redness and itching.  Respiratory: Positive for cough, chest tightness and shortness of breath. Negative for wheezing.    Cardiovascular: Positive for leg swelling. Negative for palpitations.  Gastrointestinal: Negative for nausea and vomiting.  Genitourinary: Negative for dysuria.  Musculoskeletal: Negative for joint swelling.  Skin: Negative for rash.  Neurological: Negative for headaches.  Hematological: Does not bruise/bleed easily.  Psychiatric/Behavioral: Negative for dysphoric mood. The patient is not nervous/anxious.        Objective:   Physical Exam    ambobese latino male nad  Wt Readings from Last 3 Encounters:  06/02/15 258 lb (117.028 kg)  05/26/15 259 lb (117.482 kg)  05/06/15 256 lb 3.2 oz (116.212 kg)    Vital signs reviewed  HEENT: nl dentition, turbinates, and orophanx. Nl external ear canals without cough reflex   NECK :  without JVD/Nodes/TM/ nl carotid upstrokes bilaterally   LUNGS: no acc muscle use, end exp wheeze bilaterally    CV:  RRR  no s3 or murmur or increase in P2, no edema   ABD:  soft and nontender with nl excursion in the supine position. No bruits or organomegaly, bowel sounds nl  MS:  warm without deformities, calf tenderness, cyanosis or clubbing  SKIN: warm and dry without lesions    NEURO:  alert, approp, no deficits     I personally reviewed images and agree with radiology impression as follows:  CXR:  05/06/15  No active cardiopulmonary disease         Assessment & Plan:

## 2015-06-03 ENCOUNTER — Encounter: Payer: Self-pay | Admitting: Internal Medicine

## 2015-06-03 DIAGNOSIS — E669 Obesity, unspecified: Secondary | ICD-10-CM | POA: Insufficient documentation

## 2015-06-03 NOTE — Assessment & Plan Note (Signed)
Body mass index is 42.93 kg/(m^2).  No results found for: TSH   Contributing to gerd tendency/ doe/reviewed the need and the process to achieve and maintain neg calorie balance > defer f/u primary care including intermittently monitoring thyroid status

## 2015-06-03 NOTE — Assessment & Plan Note (Addendum)
DDX of  difficult airways management all start with A and  include Adherence, Ace Inhibitors, Acid Reflux, Active Sinus Disease, Alpha 1 Antitripsin deficiency, Anxiety masquerading as Airways dz,  ABPA,  allergy(esp in young), Aspiration (esp in elderly), Adverse effects of meds,  Active smokers, A bunch of PE's (a small clot burden can't cause this syndrome unless there is already severe underlying pulm or vascular dz with poor reserve) plus two Bs  = Bronchiectasis and Beta blocker use..and one C= CHF  Adherence is always the initial "prime suspect" and is a multilayered concern that requires a "trust but verify" approach in every patient - starting with knowing how to use medications, especially inhalers, correctly, keeping up with refills and understanding the fundamental difference between maintenance and prns vs those medications only taken for a very short course and then stopped and not refilled.  - The proper method of use, as well as anticipated side effects, of a dry powder inhaler are discussed and demonstrated to the patient. Improved effectiveness after extensive coaching during this visit to a level of approximately 90% - I believe there is a language barrier that is very difficult to overcome through this office pertaining to the access of medications on his formulary. I offered to help him in this but he'll need to bring the formulary back here if the [facility]  we gave him today is not covered on his insurance only cannot pay the co-pay (6 weeks of samples)  -An alternative would be to ask him to see a Spanish-speaking asthma specialist, Dr.Bardelas, if he will still see him through his insurance plan   ? Allergy issues > the response to prednisone does suggest this but also suggested BREO is enough of an inhaled steroid to control it and also maintain on singulair daily until good control achieved on BREO then consider trial off singulair  I had an extended discussion with the patient thrus  interpreter reviewing all relevant studies completed to date and  lasting 21minutes     Each maintenance medication was reviewed in detail including most importantly the difference between maintenance and prns and under what circumstances the prns are to be triggered using an action plan format that is not reflected in the computer generated alphabetically organized AVS.    Please see instructions for details which were reviewed in writing and the patient given a copy highlighting the part that I personally wrote and discussed at today's ov.

## 2015-06-23 ENCOUNTER — Ambulatory Visit: Payer: Self-pay

## 2015-06-23 ENCOUNTER — Ambulatory Visit: Payer: Self-pay | Attending: Family Medicine | Admitting: Family Medicine

## 2015-06-23 ENCOUNTER — Encounter: Payer: Self-pay | Admitting: Family Medicine

## 2015-06-23 VITALS — BP 110/73 | HR 89 | Temp 99.0°F | Resp 16 | Ht 62.0 in | Wt 263.0 lb

## 2015-06-23 DIAGNOSIS — Z79899 Other long term (current) drug therapy: Secondary | ICD-10-CM | POA: Insufficient documentation

## 2015-06-23 DIAGNOSIS — Z23 Encounter for immunization: Secondary | ICD-10-CM | POA: Insufficient documentation

## 2015-06-23 DIAGNOSIS — J4550 Severe persistent asthma, uncomplicated: Secondary | ICD-10-CM

## 2015-06-23 DIAGNOSIS — Z0000 Encounter for general adult medical examination without abnormal findings: Secondary | ICD-10-CM

## 2015-06-23 MED ORDER — FLUTICASONE FUROATE-VILANTEROL 100-25 MCG/INH IN AEPB: 1.0000 | INHALATION_SPRAY | Freq: Every day | RESPIRATORY_TRACT | Status: DC

## 2015-06-23 NOTE — Patient Instructions (Addendum)
Nicholas Sanford was seen today for asthma.  Diagnoses and all orders for this visit:  Healthcare maintenance -     Flu Vaccine QUAD 36+ mos IM  Asthma, severe persistent, poorly-controlled, uncomplicated -     Fluticasone Furoate-Vilanterol (BREO ELLIPTA) 100-25 MCG/INH AEPB; Inhale 1 puff into the lungs daily.   You will be able to get a BREO sample today Also you can continue to get BREO with the PASS program   F/u in 3 months for asthma continue f/u with Dr. Pulmonology   Dr. Adrian Blackwater

## 2015-06-23 NOTE — Progress Notes (Signed)
F/U Asthma  No problems today  No pain

## 2015-06-23 NOTE — Progress Notes (Signed)
Patient ID: Nicholas Sanford, male   DOB: 04/13/1986, 29 y.o.   MRN: [id number]   Subjective:  Patient ID: Nicholas Sanford, male    DOB: February 14, 1986  Age: 29 y.o. MRN: [id number]  CC: Asthma   HPI Noal Landin presents for   1. Asthma: improved. Taking breo. Seen by pulm. Has sample. Using albuterol BID.  Social History  Substance Use Topics  . Smoking status: Never Smoker   . Smokeless tobacco: Never Used  . Alcohol Use: No    Outpatient Prescriptions Prior to Visit  Medication Sig Dispense Refill  . albuterol (PROVENTIL HFA;VENTOLIN HFA) 108 (90 BASE) MCG/ACT inhaler Inhale 2 puffs into the lungs every 6 (six) hours as needed for wheezing or shortness of breath. 1 Inhaler 5  . albuterol (PROVENTIL) (2.5 MG/3ML) 0.083% nebulizer solution Take 3 mLs (2.5 mg total) by nebulization every 4 (four) hours as needed for wheezing or shortness of breath. 75 mL 12  . cetirizine (ZYRTEC) 10 MG tablet Take 1 tablet (10 mg total) by mouth daily. 30 tablet 5  . fluticasone (FLONASE) 50 MCG/ACT nasal spray Place 2 sprays into both nostrils daily. (Patient taking differently: Place 1 spray into both nostrils 2 (two) times daily. ) 16 g 2  . Fluticasone Furoate-Vilanterol (BREO ELLIPTA) 100-25 MCG/INH AEPB Inhale 1 puff into the lungs 2 (two) times daily. 60 each 11  . montelukast (SINGULAIR) 10 MG tablet Take 1 tablet (10 mg total) by mouth at bedtime. 30 tablet 5   No facility-administered medications prior to visit.    ROS Review of Systems  Constitutional: Negative for fever, chills, fatigue and unexpected weight change.  Eyes: Negative for visual disturbance.  Respiratory: Positive for shortness of breath. Negative for cough and wheezing.   Cardiovascular: Negative for chest pain, palpitations and leg swelling.  Gastrointestinal: Negative for nausea, vomiting, abdominal pain, diarrhea, constipation and blood in stool.  Endocrine: Negative for polydipsia, polyphagia and polyuria.    Musculoskeletal: Negative for myalgias, back pain, arthralgias, gait problem and neck pain.  Skin: Negative for rash.  Allergic/Immunologic: Negative for immunocompromised state.  Hematological: Negative for adenopathy. Does not bruise/bleed easily.  Psychiatric/Behavioral: Negative for suicidal ideas, sleep disturbance and dysphoric mood. The patient is not nervous/anxious.     Objective:  BP 110/73 mmHg  Pulse 89  Temp(Src) 99 F (37.2 C) (Oral)  Resp 16  Ht 5\' 2"  (1.575 m)  Wt 263 lb (119.296 kg)  BMI 48.09 kg/m2  SpO2 94%  PF 260 L/min  PF Readings from Last 3 Encounters:  06/23/15 260 L/min  05/26/15 560 L/min  12/21/14 350 L/min   SpO2 Readings from Last 3 Encounters:  06/23/15 94%  06/02/15 95%  05/26/15 96%   BP/Weight 06/23/2015 06/02/2015 0/34/7425  Systolic BP 956 387 564  Diastolic BP 73 78 75  Wt. (Lbs) 263 258 259  BMI 48.09 42.93 41.82    Physical Exam  Constitutional: He appears well-developed and well-nourished. No distress.  Obese   HENT:  Head: Normocephalic and atraumatic.  Neck: Normal range of motion. Neck supple.  Cardiovascular: Normal rate, regular rhythm, normal heart sounds and intact distal pulses.   Pulmonary/Chest: Effort normal. He has decreased breath sounds. He has no wheezes. He has no rales.  Musculoskeletal: He exhibits no edema.  Neurological: He is alert.  Skin: Skin is warm and dry. No rash noted. No erythema.  Psychiatric: He has a normal mood and affect.    Assessment & Plan:   Problem List Items Addressed  This Visit    Asthma, severe persistent, poorly-controlled (Chronic)   Relevant Medications   Fluticasone Furoate-Vilanterol (BREO ELLIPTA) 100-25 MCG/INH AEPB    Other Visit Diagnoses    Healthcare maintenance    -  Primary    Relevant Orders    Flu Vaccine QUAD 36+ mos IM       No orders of the defined types were placed in this encounter.    Follow-up: No Follow-up on file.   Boykin Nearing MD

## 2015-06-24 ENCOUNTER — Encounter: Payer: Self-pay | Admitting: Family Medicine

## 2015-06-24 NOTE — Assessment & Plan Note (Signed)
You will be able to get a BREO sample today Also you can continue to get BREO with the PASS program

## 2015-10-06 MED FILL — VENTOLIN HFA 90 MCG INHALER: 108 (90 BAS | 25 days supply | Qty: 18 | Fill #4

## 2015-10-06 MED FILL — ?MONTELUKAST SOD 10 MG TAB: 10 | 30 days supply | Qty: 30 | Fill #5

## 2015-10-06 MED FILL — ?CETIRIZINE HCL 10 MG TABLE: 10 | 30 days supply | Qty: 30 | Fill #5

## 2015-10-06 MED FILL — **BREO ELLIPTA 100-25 MCG I: 100-25MCG | 14 days supply | Qty: 28 | Fill #2

## 2015-11-30 ENCOUNTER — Other Ambulatory Visit: Payer: Self-pay | Admitting: Family Medicine

## 2015-11-30 MED FILL — VENTOLIN HFA 90 MCG INHALER: 108 (90 BAS | 25 days supply | Qty: 18 | Fill #5

## 2015-12-21 MED FILL — ?MONTELUKAST SOD 10 MG TAB: 10 | 30 days supply | Qty: 30 | Fill #0

## 2015-12-21 MED FILL — ?CETIRIZINE HCL 10 MG TABLE: 10 | 30 days supply | Qty: 30 | Fill #0

## 2015-12-22 ENCOUNTER — Ambulatory Visit: Payer: Self-pay | Attending: Family Medicine

## 2015-12-28 ENCOUNTER — Ambulatory Visit: Payer: Self-pay | Attending: Family Medicine | Admitting: Family Medicine

## 2015-12-28 ENCOUNTER — Encounter: Payer: Self-pay | Admitting: Family Medicine

## 2015-12-28 VITALS — BP 114/77 | HR 89 | Temp 98.6°F | Resp 16 | Ht 65.0 in | Wt 265.0 lb

## 2015-12-28 DIAGNOSIS — K0889 Other specified disorders of teeth and supporting structures: Secondary | ICD-10-CM

## 2015-12-28 DIAGNOSIS — J4550 Severe persistent asthma, uncomplicated: Secondary | ICD-10-CM | POA: Insufficient documentation

## 2015-12-28 DIAGNOSIS — R05 Cough: Secondary | ICD-10-CM | POA: Insufficient documentation

## 2015-12-28 DIAGNOSIS — Z79899 Other long term (current) drug therapy: Secondary | ICD-10-CM | POA: Insufficient documentation

## 2015-12-28 DIAGNOSIS — R0789 Other chest pain: Secondary | ICD-10-CM | POA: Insufficient documentation

## 2015-12-28 DIAGNOSIS — R0602 Shortness of breath: Secondary | ICD-10-CM | POA: Insufficient documentation

## 2015-12-28 MED ORDER — ALBUTEROL SULFATE HFA 108 (90 BASE) MCG/ACT IN AERS: 2.0000 | INHALATION_SPRAY | Freq: Four times a day (QID) | RESPIRATORY_TRACT | Status: DC | PRN

## 2015-12-28 MED ORDER — FLUTICASONE FUROATE-VILANTEROL 100-25 MCG/INH IN AEPB: 1.0000 | INHALATION_SPRAY | Freq: Every day | RESPIRATORY_TRACT | Status: DC

## 2015-12-28 MED ORDER — MONTELUKAST SODIUM 10 MG PO TABS: 10.0000 mg | ORAL_TABLET | Freq: Every day | ORAL | Status: DC

## 2015-12-28 MED ORDER — FLUTICASONE PROPIONATE 50 MCG/ACT NA SUSP: 2.0000 | Freq: Every day | NASAL | Status: DC

## 2015-12-28 MED ORDER — CETIRIZINE HCL 10 MG PO TABS: 10.0000 mg | ORAL_TABLET | Freq: Every day | ORAL | Status: DC

## 2015-12-28 MED ORDER — METHYLPREDNISOLONE SODIUM SUCC 125 MG IJ SOLR
125.0000 mg | Freq: Once | INTRAMUSCULAR | Status: AC
  Administered 2015-12-28: 125 mg via INTRAMUSCULAR

## 2015-12-28 MED ORDER — ALBUTEROL SULFATE (2.5 MG/3ML) 0.083% IN NEBU: 2.5000 mg | INHALATION_SOLUTION | RESPIRATORY_TRACT | Status: DC | PRN

## 2015-12-28 MED FILL — FLUTICASONE PROP 50 MCG SPR: 50 | 30 days supply | Qty: 16 | Fill #0

## 2015-12-28 MED FILL — ?ALBUTEROL SUL 2.5 MG/3 MLS: (2.5 MG/3ML | 5 days supply | Qty: 90 | Fill #0

## 2015-12-28 MED FILL — !VENTOLIN HFA INHALER: 108 (90 BAS | 30 days supply | Qty: 18 | Fill #0

## 2015-12-28 NOTE — Patient Instructions (Addendum)
Nicholas Sanford was seen today for asthma.  Diagnoses and all orders for this visit:  Asthma, severe persistent, poorly-controlled, uncomplicated -     fluticasone furoate-vilanterol (BREO ELLIPTA) 100-25 MCG/INH AEPB; Inhale 1 puff into the lungs daily. -     fluticasone (FLONASE) 50 MCG/ACT nasal spray; Place 2 sprays into both nostrils daily. -     albuterol (PROVENTIL) (2.5 MG/3ML) 0.083% nebulizer solution; Take 3 mLs (2.5 mg total) by nebulization every 4 (four) hours as needed for wheezing or shortness of breath. -     cetirizine (ZYRTEC) 10 MG tablet; Take 1 tablet (10 mg total) by mouth daily. -     montelukast (SINGULAIR) 10 MG tablet; Take 1 tablet (10 mg total) by mouth at bedtime. -     albuterol (PROVENTIL HFA;VENTOLIN HFA) 108 (90 Base) MCG/ACT inhaler; Inhale 2 puffs into the lungs every 6 (six) hours as needed for wheezing or shortness of breath. -     methylPREDNISolone sodium succinate (SOLU-MEDROL) 125 mg/2 mL injection 125 mg; Inject 2 mLs (125 mg total) into the muscle once.   F/u in 4 weeks for asthma to assess control once back on Breo  Dr. Adrian Blackwater

## 2015-12-28 NOTE — Progress Notes (Signed)
F/U Asthma problems Productive cough x  1 weeks  No pain today  No tobacco user  No suicidal thoughts in the past two weeks

## 2015-12-29 MED FILL — BREO ELLIPTA 100-25 MCG INH: 100-25 | 30 days supply | Qty: 60 | Fill #0

## 2015-12-31 NOTE — Assessment & Plan Note (Signed)
Severe asthma Med non-compliant Refilled all meds

## 2015-12-31 NOTE — Progress Notes (Signed)
Subjective:  Patient ID: Nicholas Sanford, male    DOB: Apr 27, 1986  Age: 30 y.o. MRN: [id number] Spanish interpreter used  CC: Asthma   HPI Nicholas Sanford presents for   1. Asthma: no breo for past 2-3 weeks. Having chest tightness, cough, SOB. No fever, chills, CP.  Social History  Substance Use Topics  . Smoking status: Never Smoker   . Smokeless tobacco: Never Used  . Alcohol Use: No    Outpatient Prescriptions Prior to Visit  Medication Sig Dispense Refill  . albuterol (PROVENTIL HFA;VENTOLIN HFA) 108 (90 BASE) MCG/ACT inhaler Inhale 2 puffs into the lungs every 6 (six) hours as needed for wheezing or shortness of breath. 1 Inhaler 5  . albuterol (PROVENTIL) (2.5 MG/3ML) 0.083% nebulizer solution Take 3 mLs (2.5 mg total) by nebulization every 4 (four) hours as needed for wheezing or shortness of breath. 75 mL 12  . cetirizine (ZYRTEC) 10 MG tablet TAKE 1 TABLET BY MOUTH DAILY 30 tablet 2  . fluticasone (FLONASE) 50 MCG/ACT nasal spray Place 2 sprays into both nostrils daily. (Patient taking differently: Place 1 spray into both nostrils 2 (two) times daily. ) 16 g 2  . montelukast (SINGULAIR) 10 MG tablet TAKE 1 TABLET BY MOUTH AT BEDTIME 30 tablet 2  . Fluticasone Furoate-Vilanterol (BREO ELLIPTA) 100-25 MCG/INH AEPB Inhale 1 puff into the lungs daily. (Patient not taking: Reported on 12/28/2015) 60 each 11   No facility-administered medications prior to visit.    ROS Review of Systems  Constitutional: Negative for fever, chills, fatigue and unexpected weight change.  Eyes: Negative for visual disturbance.  Respiratory: Positive for chest tightness and shortness of breath. Negative for cough and wheezing.   Cardiovascular: Negative for chest pain, palpitations and leg swelling.  Gastrointestinal: Negative for nausea, vomiting, abdominal pain, diarrhea, constipation and blood in stool.  Endocrine: Negative for polydipsia, polyphagia and polyuria.  Musculoskeletal:  Negative for myalgias, back pain, arthralgias, gait problem and neck pain.  Skin: Negative for rash.  Allergic/Immunologic: Negative for immunocompromised state.  Hematological: Negative for adenopathy. Does not bruise/bleed easily.  Psychiatric/Behavioral: Negative for suicidal ideas, sleep disturbance and dysphoric mood. The patient is not nervous/anxious.     Objective:  BP 114/77 mmHg  Pulse 89  Temp(Src) 98.6 F (37 C) (Oral)  Resp 16  Ht 5\' 5"  (1.651 m)  Wt 265 lb (120.203 kg)  BMI 44.10 kg/m2  SpO2 92%  BP/Weight 12/28/2015 06/23/2015 [id number]  Systolic BP [id number]  Diastolic BP 77 73 78  Wt. (Lbs) 265 263 258  BMI 44.1 48.09 42.93   Physical Exam  Constitutional: He appears well-developed and well-nourished. No distress.  Obese   HENT:  Head: Normocephalic and atraumatic.  Neck: Normal range of motion. Neck supple.  Cardiovascular: Normal rate, regular rhythm, normal heart sounds and intact distal pulses.   Pulmonary/Chest: Effort normal. He has decreased breath sounds. He has no wheezes. He has no rales.  Musculoskeletal: He exhibits no edema.  Neurological: He is alert.  Skin: Skin is warm and dry. No rash noted. No erythema.  Psychiatric: He has a normal mood and affect.   Albuterol neb treatment x one   Assessment & Plan:   Nicholas Sanford was seen today for asthma.  Diagnoses and all orders for this visit:  Asthma, severe persistent, poorly-controlled, uncomplicated -     fluticasone furoate-vilanterol (BREO ELLIPTA) 100-25 MCG/INH AEPB; Inhale 1 puff into the lungs daily. -     fluticasone (FLONASE) 50 MCG/ACT nasal spray;  Place 2 sprays into both nostrils daily. -     albuterol (PROVENTIL) (2.5 MG/3ML) 0.083% nebulizer solution; Take 3 mLs (2.5 mg total) by nebulization every 4 (four) hours as needed for wheezing or shortness of breath. -     cetirizine (ZYRTEC) 10 MG tablet; Take 1 tablet (10 mg total) by mouth daily. -     montelukast (SINGULAIR) 10 MG  tablet; Take 1 tablet (10 mg total) by mouth at bedtime. -     albuterol (PROVENTIL HFA;VENTOLIN HFA) 108 (90 Base) MCG/ACT inhaler; Inhale 2 puffs into the lungs every 6 (six) hours as needed for wheezing or shortness of breath. -     methylPREDNISolone sodium succinate (SOLU-MEDROL) 125 mg/2 mL injection 125 mg; Inject 2 mLs (125 mg total) into the muscle once.  Pain, dental -     Ambulatory referral to Dentistry    Meds ordered this encounter  Medications  . fluticasone furoate-vilanterol (BREO ELLIPTA) 100-25 MCG/INH AEPB    Sig: Inhale 1 puff into the lungs daily.    Dispense:  60 each    Refill:  11  . fluticasone (FLONASE) 50 MCG/ACT nasal spray    Sig: Place 2 sprays into both nostrils daily.    Dispense:  16 g    Refill:  2  . albuterol (PROVENTIL) (2.5 MG/3ML) 0.083% nebulizer solution    Sig: Take 3 mLs (2.5 mg total) by nebulization every 4 (four) hours as needed for wheezing or shortness of breath.    Dispense:  75 mL    Refill:  12  . cetirizine (ZYRTEC) 10 MG tablet    Sig: Take 1 tablet (10 mg total) by mouth daily.    Dispense:  30 tablet    Refill:  11  . montelukast (SINGULAIR) 10 MG tablet    Sig: Take 1 tablet (10 mg total) by mouth at bedtime.    Dispense:  30 tablet    Refill:  11  . albuterol (PROVENTIL HFA;VENTOLIN HFA) 108 (90 Base) MCG/ACT inhaler    Sig: Inhale 2 puffs into the lungs every 6 (six) hours as needed for wheezing or shortness of breath.    Dispense:  1 Inhaler    Refill:  11  . methylPREDNISolone sodium succinate (SOLU-MEDROL) 125 mg/2 mL injection 125 mg    Sig:     Follow-up: No Follow-up on file.   Boykin Nearing MD

## 2016-01-05 ENCOUNTER — Other Ambulatory Visit: Payer: Self-pay | Admitting: *Deleted

## 2016-01-05 DIAGNOSIS — J4550 Severe persistent asthma, uncomplicated: Secondary | ICD-10-CM

## 2016-01-05 MED ORDER — ALBUTEROL SULFATE HFA 108 (90 BASE) MCG/ACT IN AERS: 2.0000 | INHALATION_SPRAY | Freq: Four times a day (QID) | RESPIRATORY_TRACT | Status: DC | PRN

## 2016-01-05 MED ORDER — FLUTICASONE FUROATE-VILANTEROL 100-25 MCG/INH IN AEPB: 1.0000 | INHALATION_SPRAY | Freq: Every day | RESPIRATORY_TRACT | Status: DC

## 2016-02-05 IMAGING — CR DG CHEST 2V
2 series · 2 of 2 positions shown · non-contrast
Comparison: None.

CLINICAL DATA: Shortness of breath, cough, wheezing, asthma

EXAM:
CHEST  2 VIEW

[chest pa]
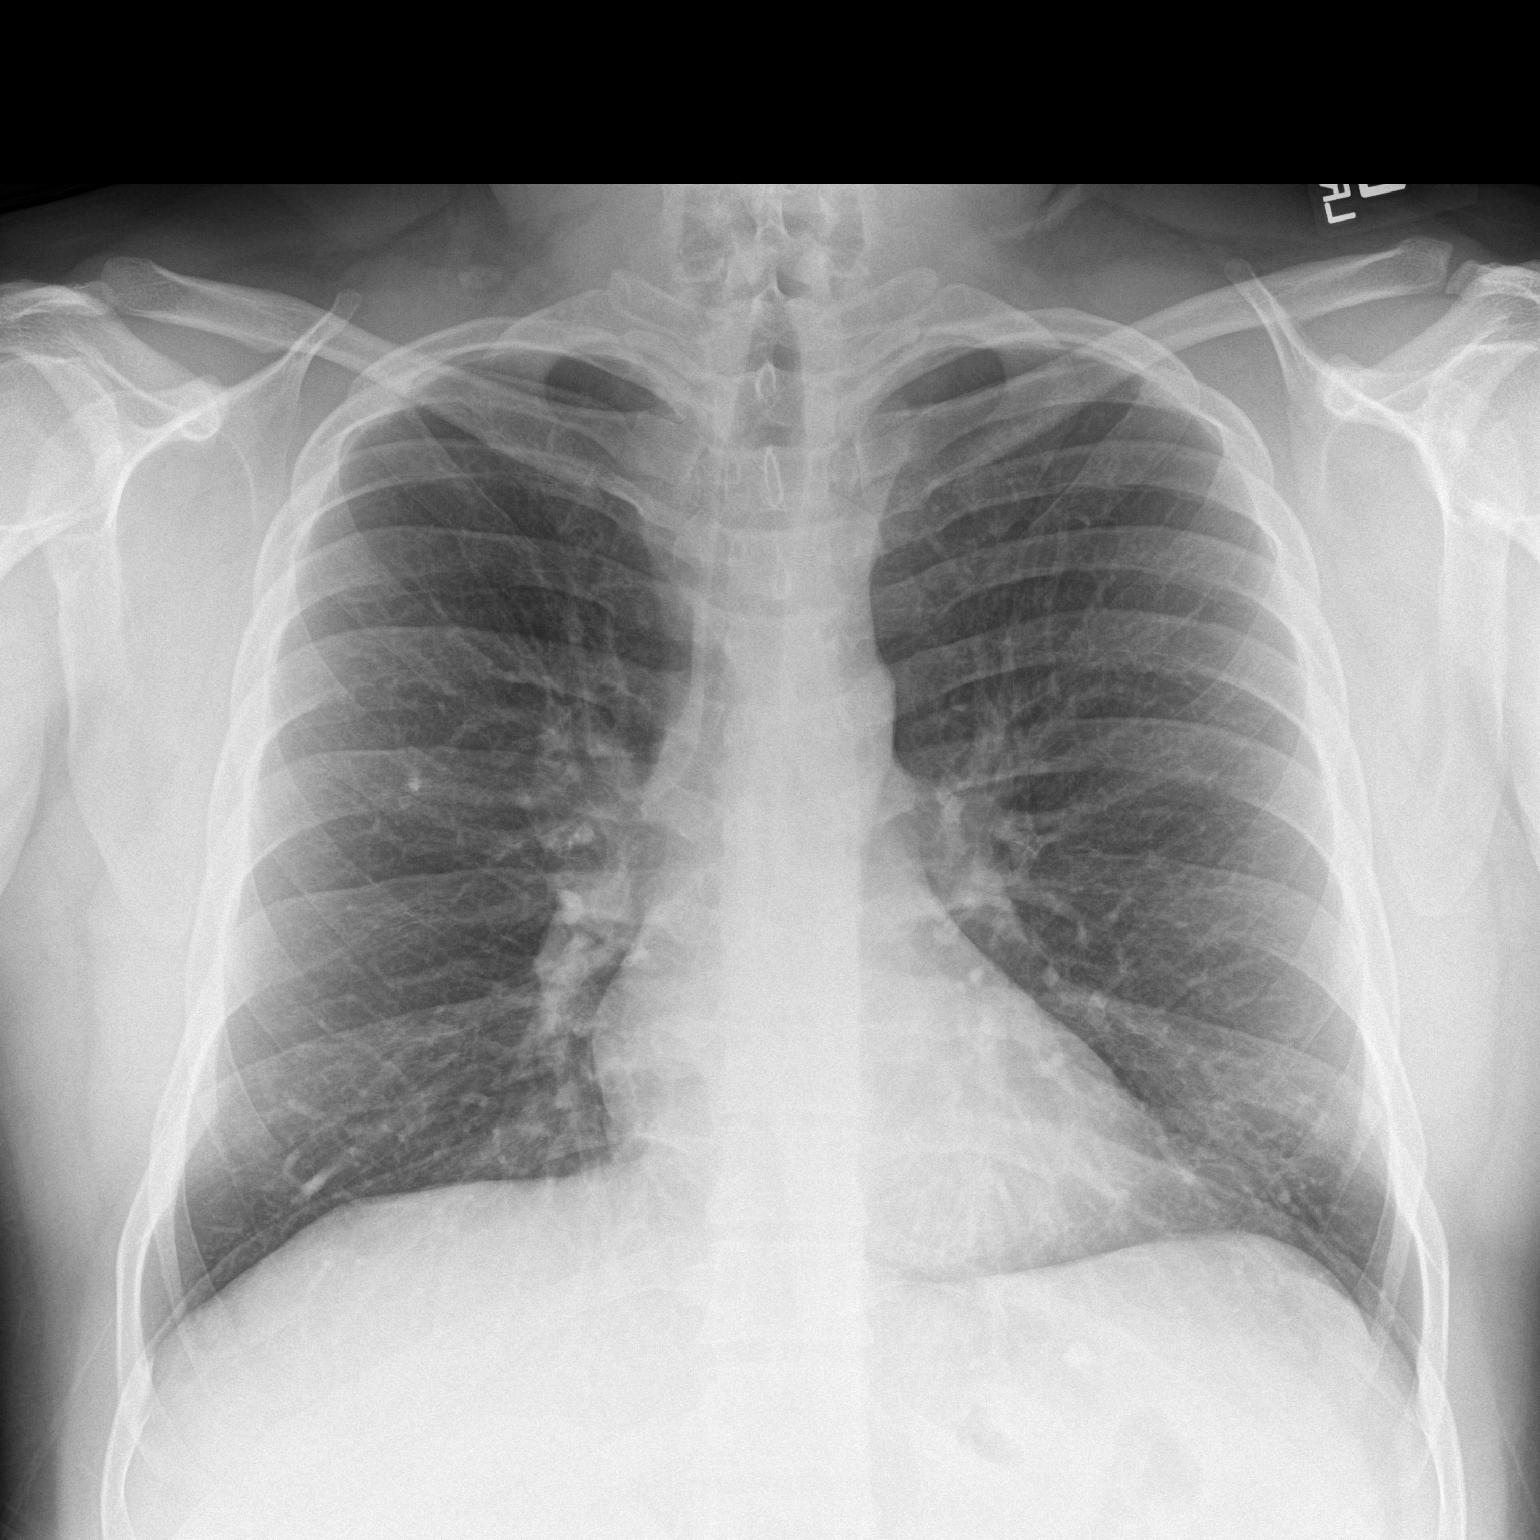

[chest lat]
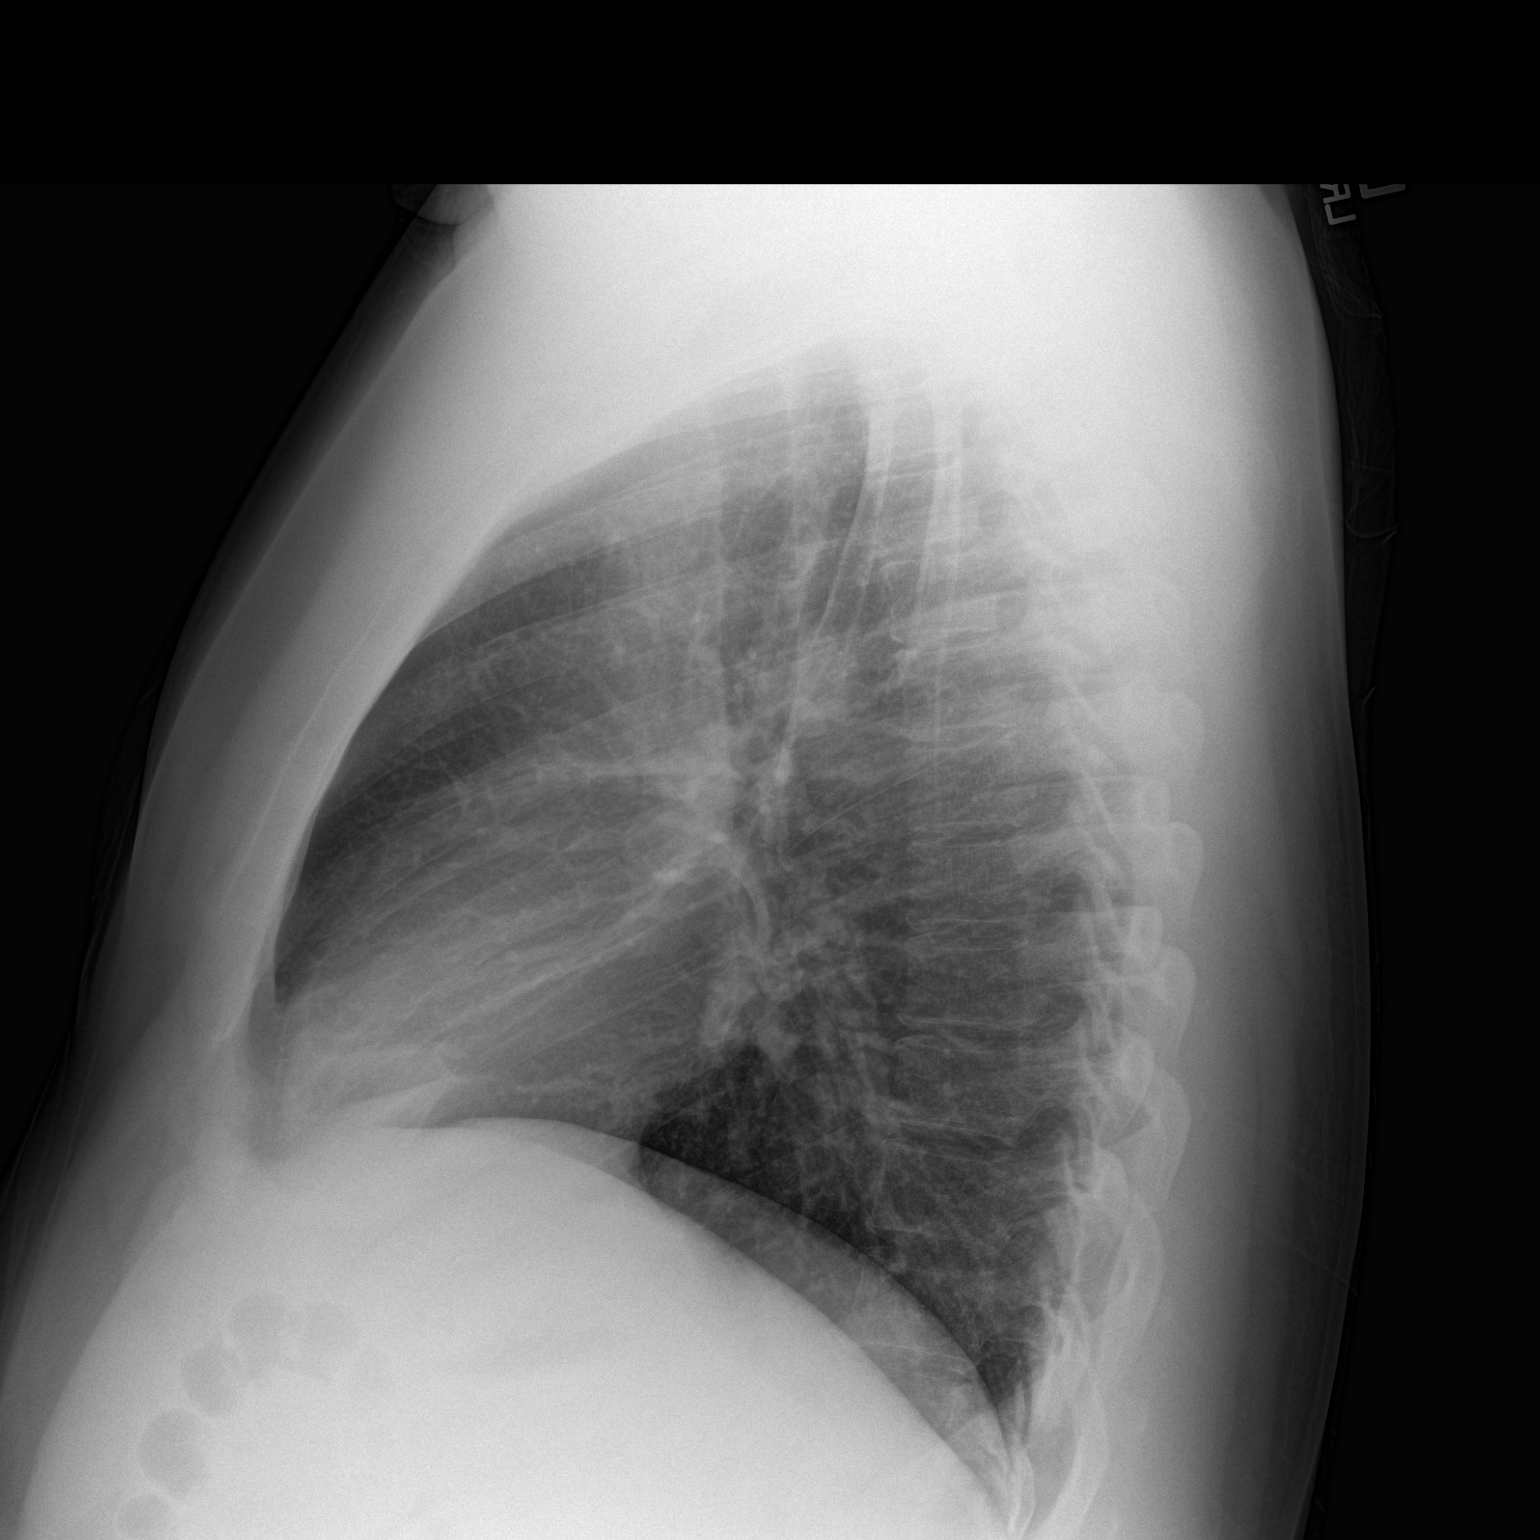

[2 of 2 positions shown; findings below may reference images not displayed]

FINDINGS: The heart size and mediastinal contours are within normal limits.
Both lungs are clear. The visualized skeletal structures are
unremarkable. Calcified right upper lobe granuloma reidentified.
IMPRESSION: No active cardiopulmonary disease.

## 2016-02-18 MED FILL — ?CETIRIZINE HCL 10 MG TABLE: 10 | 30 days supply | Qty: 30 | Fill #0

## 2016-02-18 MED FILL — $VENTOLIN HFA 18G INHALER: 108 (90 BAS | 28 days supply | Qty: 18 | Fill #0

## 2016-02-18 MED FILL — ?MONTELUKAST SOD 10 MG TAB: 10 | 30 days supply | Qty: 30 | Fill #0

## 2016-02-18 MED FILL — $BREO ELLIPTA 100-25 MCG IH: 100-25 MCG | 30 days supply | Qty: 60 | Fill #0

## 2016-03-14 MED FILL — $BREO ELLIPTA 100-25 MCG IH: 100-25 MCG | 30 days supply | Qty: 60 | Fill #1

## 2016-03-14 MED FILL — FLUTICASONE PROP 50 MCG SPR: 50 | 30 days supply | Qty: 16 | Fill #1

## 2016-03-14 MED FILL — $VENTOLIN HFA 18G INHALER: 108 (90 BAS | 28 days supply | Qty: 18 | Fill #1

## 2016-04-18 ENCOUNTER — Other Ambulatory Visit: Payer: Self-pay | Admitting: Family Medicine

## 2016-04-18 DIAGNOSIS — J4550 Severe persistent asthma, uncomplicated: Secondary | ICD-10-CM

## 2016-04-18 MED FILL — ?MONTELUKAST SOD 10 MG TAB: 10 | 30 days supply | Qty: 30 | Fill #1

## 2016-04-18 MED FILL — $BREO ELLIPTA 100-25 MCG IH: 100-25 MCG | 30 days supply | Qty: 60 | Fill #1

## 2016-04-18 MED FILL — $VENTOLIN HFA 18G INHALER: 108 (90 BAS | 30 days supply | Qty: 18 | Fill #1

## 2016-05-30 ENCOUNTER — Ambulatory Visit: Payer: Self-pay | Attending: Family Medicine | Admitting: Family Medicine

## 2016-05-30 ENCOUNTER — Encounter: Payer: Self-pay | Admitting: Family Medicine

## 2016-05-30 ENCOUNTER — Ambulatory Visit: Payer: Self-pay

## 2016-05-30 VITALS — BP 114/72 | HR 82 | Temp 98.6°F | Resp 16 | Ht 68.5 in | Wt 262.2 lb

## 2016-05-30 DIAGNOSIS — J45901 Unspecified asthma with (acute) exacerbation: Secondary | ICD-10-CM | POA: Insufficient documentation

## 2016-05-30 DIAGNOSIS — J4550 Severe persistent asthma, uncomplicated: Secondary | ICD-10-CM

## 2016-05-30 DIAGNOSIS — Z23 Encounter for immunization: Secondary | ICD-10-CM

## 2016-05-30 DIAGNOSIS — Z0000 Encounter for general adult medical examination without abnormal findings: Secondary | ICD-10-CM

## 2016-05-30 DIAGNOSIS — J4551 Severe persistent asthma with (acute) exacerbation: Secondary | ICD-10-CM

## 2016-05-30 LAB — POCT GLYCOSYLATED HEMOGLOBIN (HGB A1C): Hemoglobin A1C: 5.9

## 2016-05-30 MED ORDER — ALBUTEROL SULFATE (2.5 MG/3ML) 0.083% IN NEBU: 5.0000 mg | INHALATION_SOLUTION | Freq: Once | RESPIRATORY_TRACT | Status: DC

## 2016-05-30 MED ORDER — PREDNISONE 20 MG PO TABS: 40.0000 mg | ORAL_TABLET | Freq: Every day | ORAL | 0 refills | Status: DC

## 2016-05-30 MED ORDER — FLUTICASONE FUROATE-VILANTEROL 200-25 MCG/INH IN AEPB: 1.0000 | INHALATION_SPRAY | Freq: Every day | RESPIRATORY_TRACT | 6 refills | Status: DC

## 2016-05-30 MED ORDER — FLUTICASONE PROPIONATE 50 MCG/ACT NA SUSP: 2.0000 | Freq: Every day | NASAL | 11 refills | Status: DC

## 2016-05-30 NOTE — Patient Instructions (Addendum)
Nicholas Sanford was seen today for asthma.  Diagnoses and all orders for this visit:  Healthcare maintenance -     Tdap vaccine greater than or equal to 32yo IM  Morbid obesity, unspecified obesity type ([place]) -     HgB A1c  Asthma, severe persistent, poorly-controlled, uncomplicated -     albuterol (PROVENTIL) (2.5 MG/3ML) 0.083% nebulizer solution 5 mg; Take 6 mLs (5 mg total) by nebulization once.  Asthma with acute exacerbation, severe persistent -     predniSONE (DELTASONE) 20 MG tablet; Take 2 tablets (40 mg total) by mouth daily with breakfast. For 5 days  Other orders -     fluticasone furoate-vilanterol (BREO ELLIPTA) 200-25 MCG/INH AEPB; Inhale 1 puff into the lungs daily.  continue albuterol every 6 hrs for next 2 days, then as needed  Increase breo from 100-25 mcg/inh dose to 200-25 mgc/inh dose   F.u in 2 weeks for asthma   Dr. Adrian Blackwater

## 2016-05-30 NOTE — Assessment & Plan Note (Signed)
Patient counseled on exercise and weight loss

## 2016-05-30 NOTE — Progress Notes (Signed)
Pt presents today for follow up on asthma  Pt denies pain today  Pt does not need any refills Pt has not eaten but has had medications today

## 2016-05-30 NOTE — Progress Notes (Signed)
Subjective:  Patient ID: Nicholas Sanford, male    DOB: 02/08/1986  Age: 30 y.o. MRN: [id number] Spanish interpreter used, telephone interpreter ID # [phone] CC: Asthma   HPI Tanor Fosnaugh presents for   1. Asthma: for the past 2 weeks feeling fatigue and pressure in lungs with cough. Have increase work of breathing and wheezing. No sick contacts, no cold or flu like symptoms, no smoke exposure. Has used albuterol 6 time day, 6 nebulizer treatments. Last treatment was at 10 AM. He is amenable to flu vaccine and tetanus shot today.   His last flare up was in April 2017. When he is doing well he uses albuterol 2-3 times a day.    2. Morbid obesity: he reports walking for exercise. He knows he needs to lose weight to help with his asthma control.   Social History  Substance Use Topics  . Smoking status: Never Smoker  . Smokeless tobacco: Never Used  . Alcohol use No    Outpatient Medications Prior to Visit  Medication Sig Dispense Refill  . albuterol (PROVENTIL) (2.5 MG/3ML) 0.083% nebulizer solution Take 3 mLs (2.5 mg total) by nebulization every 4 (four) hours as needed for wheezing or shortness of breath. 75 mL 12  . cetirizine (ZYRTEC) 10 MG tablet Take 1 tablet (10 mg total) by mouth daily. 30 tablet 11  . fluticasone (FLONASE) 50 MCG/ACT nasal spray Place 2 sprays into both nostrils daily. 16 g 2  . fluticasone furoate-vilanterol (BREO ELLIPTA) 100-25 MCG/INH AEPB Inhale 1 puff into the lungs daily. 180 each 3  . montelukast (SINGULAIR) 10 MG tablet Take 1 tablet (10 mg total) by mouth at bedtime. 30 tablet 11  . VENTOLIN HFA 108 (90 Base) MCG/ACT inhaler INHALE 2 PUFFS INTO THE LUNGS EVERY 6 HOURS AS NEEDED FOR WHEEZING OR SHORTNESS OF BREATH 54 each 0   No facility-administered medications prior to visit.     ROS Review of Systems  Constitutional: Negative for chills, fatigue, fever and unexpected weight change.  Eyes: Negative for visual disturbance.  Respiratory:  Positive for cough, chest tightness, shortness of breath and wheezing.   Cardiovascular: Negative for chest pain, palpitations and leg swelling.  Gastrointestinal: Negative for abdominal pain, blood in stool, constipation, diarrhea, nausea and vomiting.  Endocrine: Negative for polydipsia, polyphagia and polyuria.  Musculoskeletal: Negative for arthralgias, back pain, gait problem, myalgias and neck pain.  Skin: Negative for rash.  Allergic/Immunologic: Negative for immunocompromised state.  Hematological: Negative for adenopathy. Does not bruise/bleed easily.  Psychiatric/Behavioral: Negative for dysphoric mood, sleep disturbance and suicidal ideas. The patient is not nervous/anxious.     Objective:  BP 114/72 (BP Location: Right Arm, Patient Position: Sitting, Cuff Size: Large)   Pulse 82   Temp 98.6 F (37 C) (Oral)   Resp 16   Ht 5' 8.5" (1.74 m)   Wt 262 lb 3.2 oz (118.9 kg)   SpO2 96%   PF 300 L/min   BMI 39.29 kg/m   BP/Weight 05/30/2016 12/28/2015 [id number]  Systolic BP 99991111 [id number] [id number]  Diastolic BP 72 77 73  Wt. (Lbs) 262.2 265 263  BMI 39.29 44.1 48.09   PF Readings from Last 3 Encounters:  05/30/16 300 L/min  06/23/15 260 L/min  05/26/15 560 L/min   Physical Exam  Constitutional: He appears well-developed and well-nourished. No distress.  Obese   HENT:  Head: Normocephalic and atraumatic.  Neck: Normal range of motion. Neck supple.  Cardiovascular: Normal rate, regular rhythm, normal heart sounds and  intact distal pulses.   Pulmonary/Chest: Effort normal. He has no decreased breath sounds. He has wheezes (diffuse ). He has no rales.  Musculoskeletal: He exhibits no edema.  Neurological: He is alert.  Skin: Skin is warm and dry. No rash noted. No erythema.  Psychiatric: He has a normal mood and affect.   Albuterol neb treatment x one   Repeat peak flow improved to 350  Lab Results  Component Value Date   HGBA1C 5.9 05/30/2016    Assessment & Plan:  Kirin  was seen today for asthma.  Diagnoses and all orders for this visit:  Healthcare maintenance -     Tdap vaccine greater than or equal to 7yo IM  Morbid obesity, unspecified obesity type ([place]) -     HgB A1c  Asthma, severe persistent, poorly-controlled, uncomplicated -     albuterol (PROVENTIL) (2.5 MG/3ML) 0.083% nebulizer solution 5 mg; Take 6 mLs (5 mg total) by nebulization once. -     fluticasone (FLONASE) 50 MCG/ACT nasal spray; Place 2 sprays into both nostrils daily.  Asthma with acute exacerbation, severe persistent -     predniSONE (DELTASONE) 20 MG tablet; Take 2 tablets (40 mg total) by mouth daily with breakfast. For 5 days  Encounter for immunization -     Flu Vaccine QUAD 36+ mos IM  Other orders -     fluticasone furoate-vilanterol (BREO ELLIPTA) 200-25 MCG/INH AEPB; Inhale 1 puff into the lungs daily.   There are no diagnoses linked to this encounter.  No orders of the defined types were placed in this encounter.   Follow-up: Return in about 2 weeks (around 06/13/2016) for asthma .   Boykin Nearing MD

## 2016-05-30 NOTE — Assessment & Plan Note (Signed)
A: patient with severe persistent poorly controlled asthma with flare up for past 2 weeks. Peak flow is 47% of predicted P: Increase breo dose to 200-25 mcg/inh daily Continue zyrtec 10 mg daily Continue flonase Continue singulair 10 mg daily  Treated current exacerbation with short course of steroids

## 2016-05-30 NOTE — Assessment & Plan Note (Signed)
A: acute exacerbation of asthma that is mild there is no O2 requirement patient is comfortable, symptoms x 2 weeks P: Increase breo dose prednisone 40 mg daily for 5 days Schedule albuterol for next 2 days, then prn   Close f/u in 2 weeks

## 2016-06-08 MED FILL — predniSONE 20 MG TABS: 20 | 5 days supply | Qty: 10 | Fill #0

## 2016-06-08 MED FILL — $BREO ELLIPTA 100-25 MCG IH: 100-25 MCG | 30 days supply | Qty: 60 | Fill #2

## 2016-06-08 MED FILL — FLUTICASONE PROP 50 MCG SPR: 50 | 30 days supply | Qty: 16 | Fill #0

## 2016-06-08 MED FILL — $VENTOLIN HFA 18G INHALER: 108 (90 BAS | 30 days supply | Qty: 18 | Fill #2

## 2016-06-29 ENCOUNTER — Ambulatory Visit: Payer: Self-pay | Attending: Internal Medicine

## 2016-07-11 MED FILL — ?CETIRIZINE HCL 10 MG TABLE: 10 | 30 days supply | Qty: 30 | Fill #1

## 2016-07-11 MED FILL — $VENTOLIN HFA 18G INHALER: 108 (90 BAS | 30 days supply | Qty: 18 | Fill #3

## 2016-07-11 MED FILL — ?MONTELUKAST SOD 10 MG TAB: 10 MG | 30 days supply | Qty: 30 | Fill #2

## 2016-08-22 MED FILL — $BREO ELLIPTA 100-25 MCG IH: 100-25 MCG | 30 days supply | Qty: 60 | Fill #3

## 2016-08-22 MED FILL — ?CETIRIZINE HCL 10 MG TABLE: 10 | 30 days supply | Qty: 30 | Fill #2

## 2016-08-22 MED FILL — $VENTOLIN HFA 18G INHALER: 108 (90 BAS | 30 days supply | Qty: 18 | Fill #4

## 2016-09-30 MED FILL — ?CETIRIZINE HCL 10 MG TABLE: 10 | 30 days supply | Qty: 30 | Fill #3

## 2016-09-30 MED FILL — $VENTOLIN HFA 18G INHALER: 108 (90 BAS | 30 days supply | Qty: 18 | Fill #5

## 2016-09-30 MED FILL — $BREO ELLIPTA 100-25 MCG IH: 100-25 MCG | 30 days supply | Qty: 60 | Fill #4

## 2016-11-24 ENCOUNTER — Telehealth: Payer: Self-pay | Admitting: Family Medicine

## 2016-11-24 DIAGNOSIS — J4550 Severe persistent asthma, uncomplicated: Secondary | ICD-10-CM

## 2016-11-24 MED ORDER — ALBUTEROL SULFATE HFA 108 (90 BASE) MCG/ACT IN AERS: INHALATION_SPRAY | RESPIRATORY_TRACT | 1 refills | Status: DC

## 2016-11-24 NOTE — Telephone Encounter (Signed)
Patient called the office to request medication refill for VENTOLIN HFA 108 (90 Base) MCG/ACT inhaler. Please send it to our [facility].  Thank you.

## 2016-11-25 MED FILL — $VENTOLIN HFA 18G INHALER: 108 (90 BAS | 25 days supply | Qty: 18 | Fill #0

## 2016-12-15 MED FILL — $VENTOLIN HFA 18G INHALER: 108 (90 BAS | 25 days supply | Qty: 18 | Fill #1

## 2016-12-19 MED FILL — MONTELUKAST SOD 10 MG TAB: 10 | 30 days supply | Qty: 30 | Fill #3

## 2016-12-19 MED FILL — FLUTICASONE PROP 50 MCG SPR: 50 | 30 days supply | Qty: 16 | Fill #1

## 2016-12-20 ENCOUNTER — Other Ambulatory Visit: Payer: Self-pay

## 2016-12-20 MED ORDER — FLUTICASONE FUROATE-VILANTEROL 200-25 MCG/INH IN AEPB: 1.0000 | INHALATION_SPRAY | Freq: Every day | RESPIRATORY_TRACT | 1 refills | Status: DC

## 2016-12-20 MED FILL — **BREO ELLIPTA 200-25 MCG I: 200-25 MCG | 30 days supply | Qty: 60 | Fill #0

## 2016-12-30 ENCOUNTER — Other Ambulatory Visit: Payer: Self-pay

## 2016-12-30 MED ORDER — FLUTICASONE FUROATE-VILANTEROL 200-25 MCG/INH IN AEPB: 1.0000 | INHALATION_SPRAY | Freq: Every day | RESPIRATORY_TRACT | 6 refills | Status: DC

## 2016-12-30 MED FILL — **BREO ELLIPTA 200-25 MCG I: 200-25 MCG | 30 days supply | Qty: 60 | Fill #0

## 2017-01-03 ENCOUNTER — Other Ambulatory Visit: Payer: Self-pay | Admitting: Pharmacist

## 2017-01-03 MED ORDER — FLUTICASONE-SALMETEROL 250-50 MCG/DOSE IN AEPB: 1.0000 | INHALATION_SPRAY | Freq: Two times a day (BID) | RESPIRATORY_TRACT | 2 refills | Status: DC

## 2017-01-09 ENCOUNTER — Ambulatory Visit: Payer: Self-pay

## 2017-01-09 ENCOUNTER — Encounter: Payer: Self-pay | Admitting: Family Medicine

## 2017-01-09 ENCOUNTER — Ambulatory Visit: Payer: Self-pay | Attending: Family Medicine | Admitting: Family Medicine

## 2017-01-09 VITALS — BP 110/70 | HR 84 | Temp 98.3°F | Wt 255.8 lb

## 2017-01-09 DIAGNOSIS — Z6838 Body mass index (BMI) 38.0-38.9, adult: Secondary | ICD-10-CM | POA: Insufficient documentation

## 2017-01-09 DIAGNOSIS — J4550 Severe persistent asthma, uncomplicated: Secondary | ICD-10-CM

## 2017-01-09 DIAGNOSIS — J45901 Unspecified asthma with (acute) exacerbation: Secondary | ICD-10-CM

## 2017-01-09 MED ORDER — MONTELUKAST SODIUM 10 MG PO TABS: 10.0000 mg | ORAL_TABLET | Freq: Every day | ORAL | 11 refills | Status: DC

## 2017-01-09 MED ORDER — FLUTICASONE PROPIONATE 50 MCG/ACT NA SUSP: 2.0000 | Freq: Every day | NASAL | 11 refills | Status: DC

## 2017-01-09 MED ORDER — PREDNISONE 20 MG PO TABS: 40.0000 mg | ORAL_TABLET | Freq: Every day | ORAL | 0 refills | Status: DC

## 2017-01-09 MED ORDER — CETIRIZINE HCL 10 MG PO TABS: 10.0000 mg | ORAL_TABLET | Freq: Every day | ORAL | 11 refills | Status: DC

## 2017-01-09 MED FILL — ?PREDNISONE 20 MG TABLET: 20 | 5 days supply | Qty: 10 | Fill #0

## 2017-01-09 MED FILL — FLUTICASONE PROP 50 MCG SPR: 50 | 30 days supply | Qty: 16 | Fill #0

## 2017-01-09 MED FILL — ?CETIRIZINE HCL 10 MG TABLE: 10 | 30 days supply | Qty: 30 | Fill #0

## 2017-01-09 MED FILL — MONTELUKAST SOD 10 MG TAB: 10 | 30 days supply | Qty: 30 | Fill #0

## 2017-01-09 NOTE — Progress Notes (Deleted)
   Subjective:  Patient ID: Nicholas Sanford, male    DOB: 1986/01/06  Age: 31 y.o. MRN: [id number]  CC: No chief complaint on file.   HPI Hooper Rumberger presents for ***  Outpatient Medications Prior to Visit  Medication Sig Dispense Refill  . albuterol (PROVENTIL) (2.5 MG/3ML) 0.083% nebulizer solution Take 3 mLs (2.5 mg total) by nebulization every 4 (four) hours as needed for wheezing or shortness of breath. 75 mL 12  . albuterol (VENTOLIN HFA) 108 (90 Base) MCG/ACT inhaler INHALE 2 PUFFS INTO THE LUNGS EVERY 6 HOURS AS NEEDED FOR WHEEZING OR SHORTNESS OF BREATH 1 Inhaler 1  . cetirizine (ZYRTEC) 10 MG tablet Take 1 tablet (10 mg total) by mouth daily. 30 tablet 11  . fluticasone (FLONASE) 50 MCG/ACT nasal spray Place 2 sprays into both nostrils daily. 16 g 11  . fluticasone furoate-vilanterol (BREO ELLIPTA) 200-25 MCG/INH AEPB Inhale 1 puff into the lungs daily. 60 each 6  . Fluticasone-Salmeterol (ADVAIR DISKUS) 250-50 MCG/DOSE AEPB Inhale 1 puff into the lungs 2 (two) times daily. 1 each 2  . montelukast (SINGULAIR) 10 MG tablet Take 1 tablet (10 mg total) by mouth at bedtime. 30 tablet 11  . predniSONE (DELTASONE) 20 MG tablet Take 2 tablets (40 mg total) by mouth daily with breakfast. For 5 days 10 tablet 0   Facility-Administered Medications Prior to Visit  Medication Dose Route Frequency Provider Last Rate Last Dose  . albuterol (PROVENTIL) (2.5 MG/3ML) 0.083% nebulizer solution 5 mg  5 mg Nebulization Once Luisangel Wainright, MD        ROS Review of Systems  Objective:  There were no vitals taken for this visit.  BP/Weight 05/30/2016 12/28/2015 75/88/3254  Systolic BP 982 641 583  Diastolic BP 72 77 73  Wt. (Lbs) 262.2 265 263  BMI 39.29 44.1 48.09    {CHL AMB CHW Note lists:[id number]}  Physical Exam   Assessment & Plan:   There are no diagnoses linked to this encounter.  No orders of the defined types were placed in this encounter.   Follow-up: No  Follow-up on file.   Boykin Nearing MD

## 2017-01-09 NOTE — Patient Instructions (Addendum)
Delrico was seen today for asthma.  Diagnoses and all orders for this visit:  Asthma, severe persistent, poorly-controlled, uncomplicated -     cetirizine (ZYRTEC) 10 MG tablet; Take 1 tablet (10 mg total) by mouth daily. -     fluticasone (FLONASE) 50 MCG/ACT nasal spray; Place 2 sprays into both nostrils daily. -     montelukast (SINGULAIR) 10 MG tablet; Take 1 tablet (10 mg total) by mouth at bedtime. -     Ambulatory referral to Allergy  Mild asthma exacerbation -     predniSONE (DELTASONE) 20 MG tablet; Take 2 tablets (40 mg total) by mouth daily with breakfast. For 5 days   F/u in 3 months for asthma   Dr. Adrian Blackwater

## 2017-01-09 NOTE — Progress Notes (Signed)
Subjective:  Patient ID: Nicholas Sanford, male    DOB: [birth year]  Age: 31 y.o. MRN: [id number] [place] Spanish interpreter used, Nicholas Sanford  ID # [phone] CC: Asthma   HPI Nicholas Sanford presents for   1. Asthma: he is compliant with all of his controlled medications. He has breo, not advair. He reports feeling having wheezing and increased cough for the past 15 days. His symptoms started when he ran out of Singulair. Prior to this he reports his last episode of asthma flare up was 3 months ago. He uses albuterol 2 times during the day. He reports he does not require it at night.   2. Morbid obesity: he reports walking for exercise. He knows he needs to lose weight to help with his asthma control. He has lost weight.   Social History  Substance Use Topics  . Smoking status: Never Smoker  . Smokeless tobacco: Never Used  . Alcohol use No    Outpatient Medications Prior to Visit  Medication Sig Dispense Refill  . albuterol (PROVENTIL) (2.5 MG/3ML) 0.083% nebulizer solution Take 3 mLs (2.5 mg total) by nebulization every 4 (four) hours as needed for wheezing or shortness of breath. 75 mL 12  . albuterol (VENTOLIN HFA) 108 (90 Base) MCG/ACT inhaler INHALE 2 PUFFS INTO THE LUNGS EVERY 6 HOURS AS NEEDED FOR WHEEZING OR SHORTNESS OF BREATH 1 Inhaler 1  . cetirizine (ZYRTEC) 10 MG tablet Take 1 tablet (10 mg total) by mouth daily. 30 tablet 11  . fluticasone (FLONASE) 50 MCG/ACT nasal spray Place 2 sprays into both nostrils daily. 16 g 11  . fluticasone furoate-vilanterol (BREO ELLIPTA) 200-25 MCG/INH AEPB Inhale 1 puff into the lungs daily. 60 each 6  . Fluticasone-Salmeterol (ADVAIR DISKUS) 250-50 MCG/DOSE AEPB Inhale 1 puff into the lungs 2 (two) times daily. 1 each 2  . montelukast (SINGULAIR) 10 MG tablet Take 1 tablet (10 mg total) by mouth at bedtime. 30 tablet 11  . predniSONE (DELTASONE) 20 MG tablet Take 2 tablets (40 mg total) by mouth daily with breakfast. For 5 days 10 tablet 0     Facility-Administered Medications Prior to Visit  Medication Dose Route Frequency Provider Last Rate Last Dose  . albuterol (PROVENTIL) (2.5 MG/3ML) 0.083% nebulizer solution 5 mg  5 mg Nebulization Once Nicholas Blaydes, MD        ROS Review of Systems  Constitutional: Negative for chills, fatigue, fever and unexpected weight change.  Eyes: Negative for visual disturbance.  Respiratory: Positive for cough, chest tightness, shortness of breath and wheezing.   Cardiovascular: Negative for chest pain, palpitations and leg swelling.  Gastrointestinal: Negative for abdominal pain, blood in stool, constipation, diarrhea, nausea and vomiting.  Endocrine: Negative for polydipsia, polyphagia and polyuria.  Musculoskeletal: Negative for arthralgias, back pain, gait problem, myalgias and neck pain.  Skin: Negative for rash.  Allergic/Immunologic: Negative for immunocompromised state.  Hematological: Negative for adenopathy. Does not bruise/bleed easily.  Psychiatric/Behavioral: Negative for dysphoric mood, sleep disturbance and suicidal ideas. The patient is not nervous/anxious.     Objective:  BP 110/70   Pulse 84   Temp 98.3 F (36.8 C) (Oral)   Wt 255 lb 12.8 oz (116 kg)   SpO2 94%   PF 450 L/min   BMI 38.33 kg/m   BP/Weight 01/09/2017 05/30/2016 10/01/5168  Systolic BP 017 494 496  Diastolic BP 70 72 77  Wt. (Lbs) 255.8 262.2 265  BMI 38.33 39.29 44.1   PF Readings from Last 3 Encounters:  01/09/17  450 L/min  05/30/16 300 L/min  06/23/15 260 L/min   Physical Exam  Constitutional: He appears well-developed and well-nourished. No distress.  Obese   HENT:  Head: Normocephalic and atraumatic.  Neck: Normal range of motion. Neck supple.  Cardiovascular: Normal rate, regular rhythm, normal heart sounds and intact distal pulses.   Pulmonary/Chest: Effort normal. He has no decreased breath sounds. He has wheezes (diffuse, faint). He has no rales.  Musculoskeletal: He exhibits no  edema.  Neurological: He is alert.  Skin: Skin is warm and dry. No rash noted. No erythema.  Psychiatric: He has a normal mood and affect.   Lab Results  Component Value Date   HGBA1C 5.9 05/30/2016    Assessment & Plan:  Nicholas Sanford was seen today for asthma.  Diagnoses and all orders for this visit:  Asthma, severe persistent, poorly-controlled, uncomplicated -     cetirizine (ZYRTEC) 10 MG tablet; Take 1 tablet (10 mg total) by mouth daily. -     fluticasone (FLONASE) 50 MCG/ACT nasal spray; Place 2 sprays into both nostrils daily. -     montelukast (SINGULAIR) 10 MG tablet; Take 1 tablet (10 mg total) by mouth at bedtime. -     Ambulatory referral to Allergy  Mild asthma exacerbation -     predniSONE (DELTASONE) 20 MG tablet; Take 2 tablets (40 mg total) by mouth daily with breakfast. For 5 days  Poorly controlled severe persistent asthma without complication   There are no diagnoses linked to this encounter.  No orders of the defined types were placed in this encounter.   Follow-up: Return in about 3 months (around 04/11/2017) for asthma.   Boykin Nearing MD

## 2017-01-10 NOTE — Assessment & Plan Note (Signed)
Control has improved with current regimen He does have evidence of mild exacerbation with cough and dyspnea  Plan: prednisone burst Continue regimen Referral to allergist Encouraged continued weight loss

## 2017-01-18 ENCOUNTER — Encounter: Payer: Self-pay | Admitting: Family Medicine

## 2017-03-14 MED FILL — !ADVAIR 250/50 DISKUS: 250-50 | 30 days supply | Qty: 60 | Fill #0

## 2017-03-14 MED FILL — ?CETIRIZINE HCL 10 MG TABLE: 10 | 30 days supply | Qty: 30 | Fill #1

## 2017-03-14 MED FILL — MONTELUKAST SOD 10 MG TAB: 10 | 30 days supply | Qty: 30 | Fill #1

## 2017-03-14 MED FILL — FLUTICASONE PROP 50 MCG SPR: 50 | 30 days supply | Qty: 16 | Fill #1

## 2017-04-11 ENCOUNTER — Encounter: Payer: Self-pay | Admitting: Family Medicine

## 2017-04-11 ENCOUNTER — Ambulatory Visit: Payer: Self-pay | Attending: Family Medicine | Admitting: Family Medicine

## 2017-04-11 VITALS — BP 107/67 | HR 78 | Temp 98.0°F | Ht 68.0 in | Wt 243.8 lb

## 2017-04-11 DIAGNOSIS — Z6830 Body mass index (BMI) 30.0-30.9, adult: Secondary | ICD-10-CM | POA: Insufficient documentation

## 2017-04-11 DIAGNOSIS — E669 Obesity, unspecified: Secondary | ICD-10-CM

## 2017-04-11 DIAGNOSIS — J4550 Severe persistent asthma, uncomplicated: Secondary | ICD-10-CM | POA: Insufficient documentation

## 2017-04-11 MED ORDER — ADVAIR DISKUS 250-50 MCG/DOSE IN AEPB: 1.0000 | INHALATION_SPRAY | Freq: Two times a day (BID) | RESPIRATORY_TRACT | 11 refills | Status: DC

## 2017-04-11 MED ORDER — MONTELUKAST SODIUM 10 MG PO TABS: 10.0000 mg | ORAL_TABLET | Freq: Every day | ORAL | 11 refills | Status: DC

## 2017-04-11 MED ORDER — FLUTICASONE PROPIONATE 50 MCG/ACT NA SUSP: 2.0000 | Freq: Every day | NASAL | 11 refills | Status: DC

## 2017-04-11 MED ORDER — ALBUTEROL SULFATE HFA 108 (90 BASE) MCG/ACT IN AERS: INHALATION_SPRAY | RESPIRATORY_TRACT | 11 refills | Status: DC

## 2017-04-11 MED ORDER — ALBUTEROL SULFATE (2.5 MG/3ML) 0.083% IN NEBU: 5.0000 mg | INHALATION_SOLUTION | RESPIRATORY_TRACT | 11 refills | Status: DC | PRN

## 2017-04-11 MED ORDER — CETIRIZINE HCL 10 MG PO TABS: 10.0000 mg | ORAL_TABLET | Freq: Every day | ORAL | 11 refills | Status: DC

## 2017-04-11 NOTE — Patient Instructions (Addendum)
Jeffrie was seen today for asthma.  Diagnoses and all orders for this visit:  Asthma, severe persistent, well-controlled -     cetirizine (ZYRTEC) 10 MG tablet; Take 1 tablet (10 mg total) by mouth daily. -     fluticasone (FLONASE) 50 MCG/ACT nasal spray; Place 2 sprays into both nostrils daily. -     montelukast (SINGULAIR) 10 MG tablet; Take 1 tablet (10 mg total) by mouth at bedtime. -     albuterol (VENTOLIN HFA) 108 (90 Base) MCG/ACT inhaler; INHALE 2 PUFFS INTO THE LUNGS EVERY 6 HOURS AS NEEDED FOR WHEEZING OR SHORTNESS OF BREATH -     albuterol (PROVENTIL) (2.5 MG/3ML) 0.083% nebulizer solution; Take 6 mLs (5 mg total) by nebulization every 4 (four) hours as needed for wheezing or shortness of breath. -     ADVAIR DISKUS 250-50 MCG/DOSE AEPB; Inhale 1 puff into the lungs 2 (two) times daily.  Excellent job with weight loss. Keep up the good work. Your asthma is well controlled.   Please return next month (september)  for flu shot with RN visit  F/u in 3 months for asthma, return sooner if needed  Dr. Adrian Blackwater

## 2017-04-11 NOTE — Progress Notes (Signed)
Peak flow results: [phone]

## 2017-04-11 NOTE — Progress Notes (Signed)
Subjective:  Patient ID: Nicholas Sanford, male    DOB: [birth year]  Age: 31 y.o. MRN: [id number]  CC: Asthma   HPI Nicholas Sanford presents for  [employer] interpreter used, [place] ID # [id number]  1. Asthma: he is compliant with all of his controlled medications. He is taking Advair. He last used albuterol about 2 weeks ago. He denies shortness of breath, cough and wheezing.   2. Morbid obesity: he has cut down on his portions. He uses a bike for exercise. He is working to lose weight.    Social History  Substance Use Topics  . Smoking status: Never Smoker  . Smokeless tobacco: Never Used  . Alcohol use No    Outpatient Medications Prior to Visit  Medication Sig Dispense Refill  . albuterol (PROVENTIL) (2.5 MG/3ML) 0.083% nebulizer solution Take 3 mLs (2.5 mg total) by nebulization every 4 (four) hours as needed for wheezing or shortness of breath. 75 mL 12  . albuterol (VENTOLIN HFA) 108 (90 Base) MCG/ACT inhaler INHALE 2 PUFFS INTO THE LUNGS EVERY 6 HOURS AS NEEDED FOR WHEEZING OR SHORTNESS OF BREATH 1 Inhaler 1  . cetirizine (ZYRTEC) 10 MG tablet Take 1 tablet (10 mg total) by mouth daily. 30 tablet 11  . fluticasone (FLONASE) 50 MCG/ACT nasal spray Place 2 sprays into both nostrils daily. 16 g 11  . montelukast (SINGULAIR) 10 MG tablet Take 1 tablet (10 mg total) by mouth at bedtime. 30 tablet 11  . predniSONE (DELTASONE) 20 MG tablet Take 2 tablets (40 mg total) by mouth daily with breakfast. For 5 days 10 tablet 0  . fluticasone furoate-vilanterol (BREO ELLIPTA) 200-25 MCG/INH AEPB Inhale 1 puff into the lungs daily. (Patient not taking: Reported on 04/11/2017) 60 each 6   Facility-Administered Medications Prior to Visit  Medication Dose Route Frequency Provider Last Rate Last Dose  . albuterol (PROVENTIL) (2.5 MG/3ML) 0.083% nebulizer solution 5 mg  5 mg Nebulization Once Dnasia Gauna, MD        ROS Review of Systems  Constitutional: Negative for chills,  fatigue, fever and unexpected weight change.  Eyes: Negative for visual disturbance.  Respiratory: Negative for cough and shortness of breath.   Cardiovascular: Negative for chest pain, palpitations and leg swelling.  Gastrointestinal: Negative for abdominal pain, blood in stool, constipation, diarrhea, nausea and vomiting.  Endocrine: Negative for polydipsia, polyphagia and polyuria.  Musculoskeletal: Negative for arthralgias, back pain, gait problem, myalgias and neck pain.  Skin: Negative for rash.  Allergic/Immunologic: Negative for immunocompromised state.  Hematological: Negative for adenopathy. Does not bruise/bleed easily.  Psychiatric/Behavioral: Negative for dysphoric mood, sleep disturbance and suicidal ideas. The patient is not nervous/anxious.     Objective:  BP 107/67   Pulse 78   Temp 98 F (36.7 C) (Oral)   Ht 5\' 8"  (1.727 m)   Wt 243 lb 12.8 oz (110.6 kg)   SpO2 95%   BMI 37.07 kg/m   BP/Weight 04/11/2017 01/09/2017 11/26/4008  Systolic BP 272 536 644  Diastolic BP 67 70 72  Wt. (Lbs) 243.8 255.8 262.2  BMI 37.07 38.33 39.29   PF Readings from Last 3 Encounters:  01/09/17 450 L/min  05/30/16 300 L/min  06/23/15 260 L/min   Physical Exam  Constitutional: He appears well-developed and well-nourished. No distress.  Obese   HENT:  Head: Normocephalic and atraumatic.  Neck: Normal range of motion. Neck supple.  Cardiovascular: Normal rate, regular rhythm, normal heart sounds and intact distal pulses.   Pulmonary/Chest: Effort normal.  He has no decreased breath sounds. He has no wheezes. He has no rales.  Musculoskeletal: He exhibits no edema.  Neurological: He is alert.  Skin: Skin is warm and dry. No rash noted. No erythema.  Psychiatric: He has a normal mood and affect.   Lab Results  Component Value Date   HGBA1C 5.9 05/30/2016    Assessment & Plan:  There are no diagnoses linked to this encounter. Nicholas Sanford was seen today for asthma.  Diagnoses and all  orders for this visit:  Asthma, severe persistent, well-controlled -     cetirizine (ZYRTEC) 10 MG tablet; Take 1 tablet (10 mg total) by mouth daily. -     fluticasone (FLONASE) 50 MCG/ACT nasal spray; Place 2 sprays into both nostrils daily. -     montelukast (SINGULAIR) 10 MG tablet; Take 1 tablet (10 mg total) by mouth at bedtime. -     albuterol (VENTOLIN HFA) 108 (90 Base) MCG/ACT inhaler; INHALE 2 PUFFS INTO THE LUNGS EVERY 6 HOURS AS NEEDED FOR WHEEZING OR SHORTNESS OF BREATH -     albuterol (PROVENTIL) (2.5 MG/3ML) 0.083% nebulizer solution; Take 6 mLs (5 mg total) by nebulization every 4 (four) hours as needed for wheezing or shortness of breath. -     ADVAIR DISKUS 250-50 MCG/DOSE AEPB; Inhale 1 puff into the lungs 2 (two) times daily.  Obesity (BMI 30-39.9)    No orders of the defined types were placed in this encounter.   Follow-up: Return in about 4 weeks (around 05/09/2017) for flu shot, RN visit, 3 month asthma follow up .   Boykin Nearing MD

## 2017-04-13 NOTE — Assessment & Plan Note (Signed)
Improved Losing weight Plan: Encourage continued weight loss

## 2017-04-13 NOTE — Assessment & Plan Note (Signed)
Controlled asthma with current regimen He has also lost weight which will greatly improve his asthma  Plan to continue current regimen

## 2017-05-17 MED FILL — FLUTICASONE PROP 50 MCG SPR: 50 | 30 days supply | Qty: 16 | Fill #0

## 2017-05-17 MED FILL — ?ALBUTEROL SUL 2.5 MG/3 MLS: (2.5 MG/3ML | 2 days supply | Qty: 90 | Fill #0

## 2017-05-17 MED FILL — !ADVAIR 250/50 DISKUS: 250-50 | 30 days supply | Qty: 60 | Fill #0

## 2017-05-17 MED FILL — ?MONTELUKAST SOD 10MG TAB: 10 | 30 days supply | Qty: 30 | Fill #0

## 2017-05-17 MED FILL — ?CETIRIZINE HCL 10 MG TABLE: 10 | 30 days supply | Qty: 30 | Fill #0

## 2017-05-17 MED FILL — !VENTOLIN HFA INHALER: 108 (90 BAS | 30 days supply | Qty: 18 | Fill #0

## 2017-06-22 MED FILL — !VENTOLIN HFA INHALER: 108 (90 BAS | 30 days supply | Qty: 18 | Fill #1

## 2017-07-10 ENCOUNTER — Encounter: Payer: Self-pay | Admitting: Family Medicine

## 2017-07-10 ENCOUNTER — Ambulatory Visit: Payer: Self-pay | Attending: Family Medicine | Admitting: Family Medicine

## 2017-07-10 VITALS — BP 114/69 | HR 95 | Temp 98.7°F | Resp 18 | Ht 68.0 in | Wt 242.8 lb

## 2017-07-10 DIAGNOSIS — J4551 Severe persistent asthma with (acute) exacerbation: Secondary | ICD-10-CM

## 2017-07-10 DIAGNOSIS — Z79899 Other long term (current) drug therapy: Secondary | ICD-10-CM | POA: Insufficient documentation

## 2017-07-10 DIAGNOSIS — R0602 Shortness of breath: Secondary | ICD-10-CM | POA: Insufficient documentation

## 2017-07-10 DIAGNOSIS — J4550 Severe persistent asthma, uncomplicated: Secondary | ICD-10-CM | POA: Insufficient documentation

## 2017-07-10 DIAGNOSIS — Z23 Encounter for immunization: Secondary | ICD-10-CM | POA: Insufficient documentation

## 2017-07-10 MED ORDER — ALBUTEROL SULFATE (2.5 MG/3ML) 0.083% IN NEBU: 5.0000 mg | INHALATION_SOLUTION | RESPIRATORY_TRACT | 11 refills | Status: DC | PRN

## 2017-07-10 MED ORDER — PREDNISONE 10 MG PO TABS: ORAL_TABLET | ORAL | 0 refills | Status: DC

## 2017-07-10 MED ORDER — ALBUTEROL SULFATE HFA 108 (90 BASE) MCG/ACT IN AERS: INHALATION_SPRAY | RESPIRATORY_TRACT | 11 refills | Status: DC

## 2017-07-10 MED ORDER — PREDNISONE 20 MG PO TABS: 40.0000 mg | ORAL_TABLET | Freq: Every day | ORAL | 0 refills | Status: DC

## 2017-07-10 MED ORDER — MONTELUKAST SODIUM 10 MG PO TABS: 10.0000 mg | ORAL_TABLET | Freq: Every day | ORAL | 11 refills | Status: DC

## 2017-07-10 MED ORDER — FLUTICASONE PROPIONATE 50 MCG/ACT NA SUSP: 2.0000 | Freq: Every day | NASAL | 11 refills | Status: DC

## 2017-07-10 MED ORDER — ADVAIR DISKUS 250-50 MCG/DOSE IN AEPB: 1.0000 | INHALATION_SPRAY | Freq: Two times a day (BID) | RESPIRATORY_TRACT | 11 refills | Status: DC

## 2017-07-10 MED ORDER — CETIRIZINE HCL 10 MG PO TABS: 10.0000 mg | ORAL_TABLET | Freq: Every day | ORAL | 11 refills | Status: DC

## 2017-07-10 MED ORDER — IPRATROPIUM-ALBUTEROL 0.5-2.5 (3) MG/3ML IN SOLN: 3.0000 mL | RESPIRATORY_TRACT | Status: DC | PRN

## 2017-07-10 MED FILL — ?CETIRIZINE HCL 10 MG TABLE: 10 | 30 days supply | Qty: 30 | Fill #0

## 2017-07-10 MED FILL — ?ALBUTEROL SUL 2.5 MG/3 MLS: (2.5 MG/3ML | 5 days supply | Qty: 90 | Fill #0

## 2017-07-10 MED FILL — ?MONTELUKAST SOD 10MG TAB: 10 | 30 days supply | Qty: 30 | Fill #0

## 2017-07-10 MED FILL — FLUTICASONE PROP 50 MCG SPR: 50 | 30 days supply | Qty: 16 | Fill #0

## 2017-07-10 MED FILL — !ADVAIR 250/50 DISKUS: 250-50 | 30 days supply | Qty: 60 | Fill #0

## 2017-07-10 MED FILL — ?PREDNISONE 20MG TABLET: 20 | 5 days supply | Qty: 10 | Fill #0

## 2017-07-10 NOTE — Progress Notes (Signed)
Subjective:  Patient ID: Nicholas Sanford, male    DOB: 10-Sep-1985  Age: 31 y.o. MRN: [id number]  CC: Follow-up   HPI Nicholas Sanford presents for follow up. He appears to be SOB of breathing and in distress. HPI intake was delayed until breathing treatment given. Interpreter services used. He reports albuterol usage 4 days out of the week. He reports only using Advair once daily.  He is currently in exacerbation. Symptoms include wheezing, SOB,  He denies any URI symptoms or fevers. He has not required Nicholas admission within the last month for asthma.   Outpatient Medications Prior to Visit  Medication Sig Dispense Refill  . ADVAIR DISKUS 250-50 MCG/DOSE AEPB Inhale 1 puff into the lungs 2 (two) times daily. 60 each 11  . albuterol (PROVENTIL) (2.5 MG/3ML) 0.083% nebulizer solution Take 6 mLs (5 mg total) by nebulization every 4 (four) hours as needed for wheezing or shortness of breath. 75 mL 11  . albuterol (VENTOLIN HFA) 108 (90 Base) MCG/ACT inhaler INHALE 2 PUFFS INTO THE LUNGS EVERY 6 HOURS AS NEEDED FOR WHEEZING OR SHORTNESS OF BREATH 1 Inhaler 11  . cetirizine (ZYRTEC) 10 MG tablet Take 1 tablet (10 mg total) by mouth daily. 30 tablet 11  . fluticasone (FLONASE) 50 MCG/ACT nasal spray Place 2 sprays into both nostrils daily. 16 g 11  . montelukast (SINGULAIR) 10 MG tablet Take 1 tablet (10 mg total) by mouth at bedtime. 30 tablet 11   No facility-administered medications prior to visit.     ROS Review of Systems  Constitutional: Negative.   HENT: Negative.   Eyes: Negative.   Respiratory: Positive for chest tightness, shortness of breath and wheezing.   Cardiovascular: Negative.   Gastrointestinal: Negative.   Skin: Negative.     Objective:  BP 114/69 (BP Location: Left Arm, Patient Position: Sitting, Cuff Size: Normal)   Pulse 95   Temp 98.7 F (37.1 C) (Oral)   Resp 18   Ht 5\' 8"  (1.727 m)   Wt 242 lb 12.8 oz (110.1 kg)   SpO2 94%   BMI 36.92 kg/m   BP/Weight  07/10/2017 03/10/7340 05/08/7901  Systolic BP 409 735 329  Diastolic BP 69 67 70  Wt. (Lbs) 242.8 243.8 255.8  BMI 36.92 37.07 38.33     Physical Exam  Constitutional: He is oriented to person, place, and time. He appears well-developed and well-nourished. He appears distressed.  Eyes: Conjunctivae are normal. Pupils are equal, round, and reactive to light.  Neck: No JVD present.  Cardiovascular: Normal rate, regular rhythm, normal heart sounds and intact distal pulses.  Pulmonary/Chest: Effort normal. He has wheezes. He has rhonchi.  Neurological: He is alert and oriented to person, place, and time.  Skin: Skin is warm and dry.  Nursing note and vitals reviewed.   Assessment & Plan:   1. Severe persistent asthma with acute exacerbation Breathing treatments given in office x 3 for wheezing and rhonchi; patient reports improvement of symptoms after first treatment.  Wheezing/rhonchi resolved in lung fields after treatments.  - ipratropium-albuterol (DUONEB) 0.5-2.5 (3) MG/3ML nebulizer solution 3 mL - ADVAIR DISKUS 250-50 MCG/DOSE AEPB; Inhale 1 puff 2 (two) times daily into the lungs.  Dispense: 60 each; Refill: 11 - Ambulatory referral to Pulmonology - albuterol (PROVENTIL) (2.5 MG/3ML) 0.083% nebulizer solution; Take 6 mLs (5 mg total) every 4 (four) hours as needed by nebulization for wheezing or shortness of breath.  Dispense: 75 mL; Refill: 11 - albuterol (VENTOLIN HFA) 108 (90 Base)  MCG/ACT inhaler; INHALE 2 PUFFS INTO THE LUNGS EVERY 6 HOURS AS NEEDED FOR WHEEZING OR SHORTNESS OF BREATH  Dispense: 1 Inhaler; Refill: 11 - cetirizine (ZYRTEC) 10 MG tablet; Take 1 tablet (10 mg total) daily by mouth.  Dispense: 30 tablet; Refill: 11 - fluticasone (FLONASE) 50 MCG/ACT nasal spray; Place 2 sprays daily into both nostrils.  Dispense: 16 g; Refill: 11 - montelukast (SINGULAIR) 10 MG tablet; Take 1 tablet (10 mg total) at bedtime by mouth.  Dispense: 30 tablet; Refill: 11  2. Needs flu  shot  - Flu Vaccine QUAD 6+ mos PF IM (Fluarix Quad PF)     Follow-up: Return in about 4 weeks (around 08/07/2017), or if symptoms worsen or fail to improve, for Asthma.   Alfonse Spruce FNP

## 2017-07-10 NOTE — Progress Notes (Signed)
Patient is here for f/up   Patient is experiencing headaches just today   Patient complains shortness of breathe

## 2017-07-10 NOTE — Patient Instructions (Signed)
Nicholas Sanford, broncoespasmo agudo (Asthma, Acute Bronchospasm) El broncoespasmo agudo causado por el Nicholas Sanford tambin se conoce como crisis de Nicholas Sanford. Broncoespasmo significa que las vas respiratorias se han estrechado. La causa del estrechamiento es la inflamacin y la constriccin de los msculos de las vas respiratorias (bronquios) que se encuentran en los pulmones. Esto puede dificultar la respiracin o provocarle sibilancias y tos. CAUSAS Los desencadenantes posibles son:  La caspa que eliminan los animales de la piel, el pelo o las plumas de los animales.  Los caros que se encuentran en el polvo de la casa.  [place].  El polen de los rboles o el csped.  Moho.  El humo del cigarrillo o del tabaco  Sustancias contaminantes como el polvo, limpiadores hogareos, aerosoles (como los aerosoles para el cabello), vapores de pintura, sustancias qumicas fuertes u olores intensos.  El aire fro o cambios climticos. El aire fro puede causar inflamacin. El viento aumenta la cantidad de moho y polen del aire.  Emociones fuertes, como llorar o rer intensamente.  Estrs.  Ciertos medicamentos como la aspirina o betabloqueantes.  Los sulfitos que se encuentran en las comidas y bebidas como frutas secas y el vino.  Enfermedades infecciosas o inflamatorias, como la gripe, el resfro o la inflamacin de las membranas nasales (rinitis).  El reflujo gastroesofgico (ERGE). El reflujo gastroesofgico es una afeccin en la que los cidos estomacales vuelven al esfago.  Los ejercicios o actividades extenuantes. SIGNOS Y SNTOMAS  Sibilancias.  Tos intensa, especialmente por la noche.  Opresin en el pecho.  Falta de aire.  DIAGNSTICO El mdico le har una historia clnica y le har un examen fsico. Le indicarn radiografas o anlisis de sangre para buscar otras causas de los sntomas u otras enfermedades que puedan desencadenar una crisis de Nicholas Sanford. TRATAMIENTO El tratamiento est  dirigido a reducir la inflamacin y abrir las vas respiratorias en los pulmones. La mayor parte de las crisis asmticas se tratan con medicamentos por va inhalatoria. Entre ellos se incluyen los medicamentos de alivio rpido o medicamentos de rescate (como los broncodilatadores) y los medicamentos de control (como los corticoides inhalados). Estos medicamentos se administran a travs de un inhalador o de un nebulizador. Los corticoides sistmicos por va oral o por va intravenosa tambin se administran para reducir la inflamacin cuando un ataque es moderado o grave. Los antibiticos se indican solo si hay infeccin bacteriana. INSTRUCCIONES PARA EL CUIDADO EN EL HOGAR  Reposo.  Beba lquido en abundancia. Esto ayuda a diluir la mucosidad y a eliminarla fcilmente. Solo consuma productos con cafena moderadamente y no consuma alcohol hasta que se haya recuperado de la enfermedad.  No fume. Evite la exposicin al humo de otros fumadores.  Usted tiene un rol fundamental en mantener su buena salud. Evite la exposicin a lo que le ocasiona los problemas respiratorios.  Mantenga los medicamentos actualizados y al alcance. Siga cuidadosamente el plan de tratamiento del mdico.  Utilice los medicamentos tal como se le indic.  Cuando haya mucho polen o polucin, mantenga las ventanas cerradas y use el aire acondicionado o vaya a lugares con aire acondicionado.  El Nicholas Sanford requiere atencin mdica exhaustiva. Concurra a los controles segn las indicaciones. Si tiene un embarazo de ms de 24 semanas y le han recetado medicamentos nuevos, comntelo con su obstetra y cul es su evolucin. Concurra a las consultas de control con su mdico segn las indicaciones.  Despus de recuperarse de la crisis de Nicholas Sanford, haga una cita con el mdico para conocer cmo   puede reducir la probabilidad de futuros ataques. Si no cuenta con un mdico para que controle su Nicholas Sanford, haga una cita con un mdico de atencin primaria para  hablar de esta enfermedad.  SOLICITE ATENCIN MDICA DE INMEDIATO SI:  Empeora.  Tiene dificultad para respirar. Si la dificultad es intensa comunquese con el servicio de emergencias de su localidad (911 en los [place]).  Siente dolor o molestias en el pecho.  Tiene vmitos.  No puede retener los lquidos.  Elimina una expectoracin verde, amarilla, amarronada o sanguinolenta.  Tiene fiebre y los sntomas empeoran repentinamente.  Presenta dificultad para tragar.  ASEGRESE DE QUE:  Comprende estas instrucciones.  Controlar su afeccin.  Recibir ayuda de inmediato si no mejora o si empeora.  Esta informacin no tiene como fin reemplazar el consejo del mdico. Asegrese de hacerle al mdico cualquier pregunta que tenga. Document Released: 12/08/2008 Document Revised: 08/27/2013 Document Reviewed: 02/27/2013 Elsevier Interactive Patient Education  2017 Elsevier Inc.  

## 2017-07-19 ENCOUNTER — Ambulatory Visit: Payer: Self-pay | Attending: Family Medicine

## 2017-08-11 ENCOUNTER — Other Ambulatory Visit: Payer: Self-pay | Admitting: *Deleted

## 2017-08-11 DIAGNOSIS — J4551 Severe persistent asthma with (acute) exacerbation: Secondary | ICD-10-CM

## 2017-08-11 MED ORDER — ADVAIR DISKUS 250-50 MCG/DOSE IN AEPB: 1.0000 | INHALATION_SPRAY | Freq: Two times a day (BID) | RESPIRATORY_TRACT | 3 refills | Status: DC

## 2017-08-11 MED ORDER — ALBUTEROL SULFATE HFA 108 (90 BASE) MCG/ACT IN AERS: INHALATION_SPRAY | RESPIRATORY_TRACT | 3 refills | Status: DC

## 2017-08-11 NOTE — Telephone Encounter (Signed)
PRINTED FOR PASS PROGRAM 

## 2017-08-21 ENCOUNTER — Ambulatory Visit: Payer: Self-pay | Attending: Family Medicine | Admitting: Family Medicine

## 2017-08-21 ENCOUNTER — Encounter: Payer: Self-pay | Admitting: Family Medicine

## 2017-08-21 VITALS — BP 128/73 | HR 103 | Temp 98.7°F | Resp 18 | Ht 67.0 in | Wt 257.2 lb

## 2017-08-21 DIAGNOSIS — J4550 Severe persistent asthma, uncomplicated: Secondary | ICD-10-CM | POA: Insufficient documentation

## 2017-08-21 DIAGNOSIS — Z79899 Other long term (current) drug therapy: Secondary | ICD-10-CM | POA: Insufficient documentation

## 2017-08-21 DIAGNOSIS — Z7951 Long term (current) use of inhaled steroids: Secondary | ICD-10-CM | POA: Insufficient documentation

## 2017-08-21 MED FILL — !VENTOLIN HFA INHALER: 108 (90 BAS | 25 days supply | Qty: 18 | Fill #2

## 2017-08-21 NOTE — Progress Notes (Signed)
Subjective:  Patient ID: Nicholas Sanford, male    DOB: 1986/05/26  Age: 31 y.o. MRN: [id number]  CC: Follow-up   HPI Nicholas Sanford presents for asthma follow up. History of asthma: Onset since birth. He denies any distress or SOB.  He reports adherence with current medications.  He is not currently in exacerbation.He denies any URI symptoms or fevers. He has not required ED admission within the last month for asthma. He is not a current smoker. Asthma triggers include cold weather, hot temperatures, and pollen.  Outpatient Medications Prior to Visit  Medication Sig Dispense Refill  . ADVAIR DISKUS 250-50 MCG/DOSE AEPB Inhale 1 puff into the lungs 2 (two) times daily. 180 each 3  . albuterol (PROVENTIL) (2.5 MG/3ML) 0.083% nebulizer solution Take 6 mLs (5 mg total) every 4 (four) hours as needed by nebulization for wheezing or shortness of breath. 75 mL 11  . albuterol (VENTOLIN HFA) 108 (90 Base) MCG/ACT inhaler INHALE 2 PUFFS INTO THE LUNGS EVERY 6 HOURS AS NEEDED FOR WHEEZING OR SHORTNESS OF BREATH 180 g 3  . cetirizine (ZYRTEC) 10 MG tablet Take 1 tablet (10 mg total) daily by mouth. 30 tablet 11  . fluticasone (FLONASE) 50 MCG/ACT nasal spray Place 2 sprays daily into both nostrils. 16 g 11  . montelukast (SINGULAIR) 10 MG tablet Take 1 tablet (10 mg total) at bedtime by mouth. 30 tablet 11  . predniSONE (DELTASONE) 20 MG tablet Take 2 tablets (40 mg total) daily with breakfast by mouth. For 5 days. 10 tablet 0   Facility-Administered Medications Prior to Visit  Medication Dose Route Frequency Provider Last Rate Last Dose  . ipratropium-albuterol (DUONEB) 0.5-2.5 (3) MG/3ML nebulizer solution 3 mL  3 mL Nebulization Q20 Min PRN Alfonse Spruce, FNP       Review of Systems  Constitutional: Negative.   HENT: Negative.   Eyes: Negative for discharge.  Respiratory: Negative.   Cardiovascular: Negative.   Skin: Negative.     Objective:  BP 128/73 (BP Location: Left Arm,  Patient Position: Sitting, Cuff Size: Normal)   Pulse (!) 103   Temp 98.7 F (37.1 C) (Oral)   Resp 18   Ht 5\' 7"  (1.702 m)   Wt 257 lb 3.2 oz (116.7 kg)   SpO2 95%   BMI 40.28 kg/m   BP/Weight 08/21/2017 62/11/7626 11/04/5174  Systolic BP 160 737 106  Diastolic BP 73 69 67  Wt. (Lbs) 257.2 242.8 243.8  BMI 40.28 36.92 37.07   Physical Exam  Constitutional: He is well-developed, well-nourished, and in no distress.  HENT:  Head: Normocephalic and atraumatic.  Right Ear: External ear normal.  Left Ear: External ear normal.  Nose: Nose normal.  Mouth/Throat: Oropharynx is clear and moist.  Eyes: Conjunctivae are normal. Pupils are equal, round, and reactive to light. Right eye exhibits no discharge. Left eye exhibits no discharge.  Neck: Normal range of motion. Neck supple.  Cardiovascular: Normal rate, regular rhythm, normal heart sounds and intact distal pulses.  Pulmonary/Chest: Effort normal and breath sounds normal. No respiratory distress. He has no wheezes.  Lymphadenopathy:    He has no cervical adenopathy.  Skin: Skin is warm and dry.  Psychiatric: Affect normal.  Nursing note and vitals reviewed.   Assessment & Plan:   1. Asthma, severe persistent, well-controlled Continue current regimen. Follow up with previous pulmonology referral.       Follow-up: Return in about 3 months (around 11/19/2017) for Asthma.   Alfonse Spruce FNP

## 2017-08-21 NOTE — Progress Notes (Signed)
Patient is here for f/up  

## 2017-08-21 NOTE — Patient Instructions (Addendum)
Contact office to schedule appointment. [facility] [address], [place], [place] [address]  [phone] Prevencin de las crisis de asma en los adultos (Asthma Attack Prevention, Adult) Aunque probablemente no pueda controlar el hecho de ser una persona asmtica, puede tomar medidas para prevenir los episodios de asma (crisis de asma). Estas medidas incluyen lo siguiente:  Elaborar un plan por escrito para el control y el tratamiento de las crisis de asma (plan de accin para el asma).  Controlar el asma.  Evitar las cosas que pueden irritarle las vas respiratorias o intensificar los sntomas del asma (desencadenantes del asma).  Tomar los medicamentos segn las indicaciones.  Actuar rpidamente si hay signos o sntomas de una crisis de asma. CULES SON LAS FORMAS DE PREVENIR UNA CRISIS DE ASMA? [place]ore un plan [place] con el mdico para elaborar un plan de accin para el asma. Este plan debe incluir lo siguiente:  Una lista de los factores desencadenantes del asma y el modo de evitarlos.  Una lista de los sntomas que experimenta durante una crisis de asma.  Informacin sobre cundo tomar medicamentos y cunto tomar.  Informacin para ayudar a comprender las mediciones de flujo espiratorio mximo.  Informacin de contacto para sus mdicos.  Medidas diarias que puede tomar para controlar el asma. Controle el asma Para controlar el asma:  Utilice un espirmetro todas las [place] y todas las noches durante 2 a 3semanas. Registre los [employer] en un diario. Una disminucin en los valores de las mediciones de flujo espiratorio mximo durante un da o ms puede significar que est comenzando a tener una crisis de asma, aunque no tenga sntomas.  Cuando tenga los sntomas del asma, antelos en un diario. Evite los factores desencadenantes del asma [place] con el mdico para determinar cules son los factores desencadenantes. Esto puede lograrse de la  siguiente manera:  Someterse a pruebas de Set designer.  Llevar un diario que indique cundo ocurren las crisis de asma y qu puede haber contribuido a que estas sucedan.  Preguntarle al mdico si otras afecciones mdicas empeoran el asma. Los factores desencadenantes frecuentes incluyen lo siguiente:  Polvo.  Humo. Esto incluye el humo de las fogatas y el humo ambiental de los productos que contienen tabaco.  La caspa de las mascotas.  rboles, pastos o plenes.  Aire muy fro, seco o hmedo.  Moho.  Alimentos con grandes cantidades de sulfitos.  Aromas fuertes.  Escapes de motores y contaminacin del aire.  Aerosoles y vapores de productos de limpieza para Engineer, mining.  Plagas hogareas y sus excrementos, incluidos los caros del polvo y las cucarachas.  Algunos medicamentos, incluidos los antiinflamatorios no esteroides (AINE). Una vez que haya determinado cules son los factores desencadenantes del asma, tome las medidas para evitarlos. Dependiendo de sus factores desencadenantes, puede reducir las probabilidades de una crisis de asma de las siguientes maneras:  Mantener la casa limpia. Pdale a otra persona que limpie su casa y pase la aspiradora 1 o 2veces por semana. Si es posible, pdale que use una aspiradora con filtro de partculas de alto rendimiento (HEPA).  Lavar las sbanas en agua caliente de forma semanal.  Usar fundas y cubiertas para colchn antialrgicas en la cama.  Mantener las mascotas fuera de la casa.  Resolver los problemas de moho y agua en la casa.  Evitar las zonas donde la gente fuma.  Evitar el uso de perfumes fuertes o desodorantes en aerosol.  No pase mucho tiempo al [employer]  concentraciones de polen son elevadas y [place] son muy ventosos.  Consultar al mdico antes de interrumpir o empezar cualquier medicamento nuevo. Medicamentos [employer] de venta libre y los recetados solamente como se lo haya  indicado el mdico. Muchas crisis de asma se pueden prevenir si se sigue cuidadosamente el plan de administracin de medicamentos. Es Scientist, research (life sciences) tomar los medicamentos del modo correcto cuando no se pueden evitar determinados factores desencadenantes del asma. Aunque se est sintiendo bien, no deje de [employer] y no tome menos cantidad de medicamento. Acte con rapidez Si se produce una crisis de asma, actuar con rapidez puede atenuar su gravedad y reducir su duracin. Tome las siguientes medidas:  Est atento a los sntomas. Si tiene tos, sibilancias o dificultad para respirar, no espere hasta que los sntomas desaparezcan por s solos. Siga el plan de accin para el asma.  Si sigui el plan de accin para el asma y los sntomas no mejoran, llame al mdico o solicite atencin mdica de inmediato en el hospital ms cercano. Es importante que anote la frecuencia con la que tiene que usar el inhalador de rescate de accin rpida. Puede registrar este dato en su diario. Si est utilizando Forensic psychologist de rescate con ms frecuencia, tal vez esto signifique que el asma no est bajo control. Modificar el plan de tratamiento para controlar el asma puede ayudarlo a prevenir las crisis de asma en el futuro y a Scientist, forensic un mejor control de la enfermedad. CMO PUEDO EVITAR UNA CRISIS DE ASMA CUANDO HAGO ACTIVIDAD FSICA? La actividad fsica es un factor desencadenante frecuente del asma. [place] crisis de asma durante la actividad fsica:  Siga los consejos del mdico respecto de cundo usar el Tax inspector de accin rpida antes de Field seismologist actividad fsica. Muchas personas que tienen asma sufren la broncoconstriccin inducida por el ejercicio (BCIE). Esta afeccin suele agravarse al realizar actividad fsica enrgica en ambientes fros, hmedos o secos. Las personas con BCIE suelen estar muy activas gracias al uso de un inhalador de accin rpida antes de hacer ejercicio.  Evite hacer  actividad fsica al aire libre cuando el clima est muy fro o hmedo.  Evite hacer actividad fsica cuando las concentraciones de polen son elevadas.  Realice un precalentamiento y Ardelia Mems relajacin cuando haga ejercicio.  Deje de hacer ejercicio de inmediato si aparece el asma. Considere participar en ejercicios con menor probabilidad de producir los sntomas del asma, por ejemplo:  Natacin en una piscina cubierta.  Andar en bicicleta.  Caminar.  Practicar senderismo.  Jugar al ftbol. Esta informacin no tiene Marine scientist el consejo del mdico. Asegrese de hacerle al mdico cualquier pregunta que tenga. Document Released: 08/08/2012 Document Revised: 05/13/2015 Document Reviewed: 02/06/2016 Elsevier Interactive Patient Education  2017 [employer].

## 2017-10-13 MED FILL — $ADVAIR 250/50 MCG INHALER: 250-50 | 90 days supply | Qty: 180 | Fill #0

## 2017-10-13 MED FILL — ?CETIRIZINE HCL 10 MG TABLE: 10 | 30 days supply | Qty: 30 | Fill #1

## 2017-10-13 MED FILL — ?MONTELUKAST SOD 10 MG TAB: 10 | 30 days supply | Qty: 30 | Fill #1

## 2017-10-13 MED FILL — FLUTICASONE PROP 50 MCG SPR: 50 | 30 days supply | Qty: 16 | Fill #1

## 2017-10-13 MED FILL — $VENTOLIN HFA 18G INHALER: 108 (90 BAS | 75 days supply | Qty: 54 | Fill #0

## 2017-11-20 ENCOUNTER — Ambulatory Visit: Payer: Self-pay | Admitting: Family Medicine

## 2018-01-04 ENCOUNTER — Telehealth: Payer: Self-pay | Admitting: Family Medicine

## 2018-01-04 MED FILL — $VENTOLIN HFA 18G INHALER: 108 (90 BAS | 75 days supply | Qty: 54 | Fill #1

## 2018-01-04 MED FILL — $ADVAIR 250/50 MCG INHALER: 250-50 | 90 days supply | Qty: 180 | Fill #1

## 2018-01-04 NOTE — Telephone Encounter (Signed)
Pt call to request a refill for albuterol (VENTOLIN HFA) 108 (90 Base) MCG/ACT inhaler  Please sent it to [facility] pharmacy, please follow up

## 2018-01-05 NOTE — Telephone Encounter (Signed)
Patient has appointment with you on 6/10. He has not been here since December.

## 2018-01-08 ENCOUNTER — Other Ambulatory Visit: Payer: Self-pay | Admitting: Nurse Practitioner

## 2018-01-08 DIAGNOSIS — J4551 Severe persistent asthma with (acute) exacerbation: Secondary | ICD-10-CM

## 2018-01-08 MED ORDER — ALBUTEROL SULFATE HFA 108 (90 BASE) MCG/ACT IN AERS: INHALATION_SPRAY | RESPIRATORY_TRACT | 3 refills | Status: DC

## 2018-01-08 NOTE — Telephone Encounter (Signed)
Inhaler has been sent. Thank you

## 2018-02-09 ENCOUNTER — Ambulatory Visit: Payer: Self-pay | Attending: Internal Medicine

## 2018-02-12 ENCOUNTER — Ambulatory Visit: Payer: Self-pay | Admitting: Nurse Practitioner

## 2018-03-07 ENCOUNTER — Ambulatory Visit: Payer: Self-pay | Attending: Nurse Practitioner | Admitting: Nurse Practitioner

## 2018-03-07 ENCOUNTER — Encounter: Payer: Self-pay | Admitting: Nurse Practitioner

## 2018-03-07 VITALS — BP 117/74 | HR 81 | Temp 99.4°F | Ht 69.0 in | Wt 256.8 lb

## 2018-03-07 DIAGNOSIS — Z6839 Body mass index (BMI) 39.0-39.9, adult: Secondary | ICD-10-CM | POA: Insufficient documentation

## 2018-03-07 DIAGNOSIS — Z79899 Other long term (current) drug therapy: Secondary | ICD-10-CM | POA: Insufficient documentation

## 2018-03-07 DIAGNOSIS — Z0000 Encounter for general adult medical examination without abnormal findings: Secondary | ICD-10-CM

## 2018-03-07 DIAGNOSIS — R7303 Prediabetes: Secondary | ICD-10-CM | POA: Insufficient documentation

## 2018-03-07 DIAGNOSIS — J4520 Mild intermittent asthma, uncomplicated: Secondary | ICD-10-CM

## 2018-03-07 DIAGNOSIS — Z87898 Personal history of other specified conditions: Secondary | ICD-10-CM

## 2018-03-07 DIAGNOSIS — Z760 Encounter for issue of repeat prescription: Secondary | ICD-10-CM | POA: Insufficient documentation

## 2018-03-07 LAB — POCT GLYCOSYLATED HEMOGLOBIN (HGB A1C): HEMOGLOBIN A1C: 5.6 % (ref 4.0–5.6)

## 2018-03-07 LAB — GLUCOSE, POCT (MANUAL RESULT ENTRY): POC GLUCOSE: 98 mg/dL (ref 70–99)

## 2018-03-07 MED ORDER — MONTELUKAST SODIUM 10 MG PO TABS: 10.0000 mg | ORAL_TABLET | Freq: Every day | ORAL | 11 refills | Status: DC

## 2018-03-07 MED FILL — ?MONTELUKAST SODI 10MG TAB: 10 | 30 days supply | Qty: 30 | Fill #2

## 2018-03-07 NOTE — Progress Notes (Signed)
Assessment & Plan:  Nicholas Sanford was seen today for establish care and medication refill.  Diagnoses and all orders for this visit:  History of prediabetes -     Glucose (CBG) -     HgB A1c Continue blood sugar control as discussed in office today, low carbohydrate diet, and regular physical exercise as tolerated, 150 minutes per week (30 min each day, 5 days per week, or 50 min 3 days per week). Annual eye exams and foot exams are recommended.   Mild intermittent asthma without complication -     montelukast (SINGULAIR) 10 MG tablet; Take 1 tablet (10 mg total) by mouth at bedtime. Take inhalers as prescribed.    Severe obesity (BMI 35.0-39.9) with comorbidity ([place]) Discussed diet and exercise for person with BMI >25. Instructed: You must burn more calories than you eat. Losing 5 percent of your body weight should be considered a success. In the longer term, losing more than 15 percent of your body weight and staying at this weight is an extremely good result. However, keep in mind that even losing 5 percent of your body weight leads to important health benefits, so try not to get discouraged if you're not able to lose more than this. Will recheck weight in 3-6 months.  Routine adult health maintenance -     Basic metabolic panel -     CBC -     Lipid panel    Patient has been counseled on age-appropriate routine health concerns for screening and prevention. These are reviewed and up-to-date. Referrals have been placed accordingly. Immunizations are up-to-date or declined.    Subjective:   Chief Complaint  Patient presents with  . Establish Care    Pt. is here to establish care asthma.  . Medication Refill   HPI Nicholas Sanford 32 y.o. male presents to office today to establish care. He has a history of Asthma since childhood. He has a recent history of prediabetes however A1c is normal today. He is requesting a referral to eye doctor today due to intermittent blurred vision. He  denies any headaches or URI symptoms.  Lab Results  Component Value Date   HGBA1C 5.6 03/07/2018    Asthma:  Nicholas Sanford presents for evaluation of asthma. Patient's symptoms are currently stable.  Associated symptoms include wheezing. The patient has been suffering from these symptoms for approximately 30+  years. Symptoms have been stable since their onset. Medications used in the past to treat these symptoms include singulair 10mg ,  advair diskus 250-58mcg/Albuterol 108 HFA 2 puffs q 6hrs Prn. Suspected precipitants include no identifiable factor. Patient is awoken from sleep approximately 0 times per night. Patient has not required Emergency Room treatment for these symptoms, and has not required hospitalization. The patient has not been intubated in the past.  Review of Systems  Constitutional: Negative for fever, malaise/fatigue and weight loss.  HENT: Negative.  Negative for nosebleeds.   Eyes: Negative.  Negative for blurred vision, double vision and photophobia.  Respiratory: Negative.  Negative for cough, shortness of breath and wheezing.   Cardiovascular: Negative.  Negative for chest pain, palpitations and leg swelling.  Gastrointestinal: Negative.  Negative for heartburn, nausea and vomiting.  Musculoskeletal: Negative.  Negative for myalgias.  Neurological: Negative.  Negative for dizziness, focal weakness, seizures and headaches.  Endo/Heme/Allergies: Positive for environmental allergies.  Psychiatric/Behavioral: Negative.  Negative for suicidal ideas.    Past Medical History:  Diagnosis Date  . Allergy    sesonal   .  Asthma    as a child     Past Surgical History:  Procedure Laterality Date  . APPENDECTOMY  2014  . LAPAROSCOPIC APPENDECTOMY N/A 08/11/2013   Procedure: APPENDECTOMY LAPAROSCOPIC;  Surgeon: Shann Medal, MD;  Location: [facility];  Service: General;  Laterality: N/A;    Family History  Problem Relation Age of Onset  . Asthma Mother     Social  History Reviewed with no changes to be made today.   Outpatient Medications Prior to Visit  Medication Sig Dispense Refill  . ADVAIR DISKUS 250-50 MCG/DOSE AEPB Inhale 1 puff into the lungs 2 (two) times daily. 180 each 3  . albuterol (PROVENTIL) (2.5 MG/3ML) 0.083% nebulizer solution Take 6 mLs (5 mg total) every 4 (four) hours as needed by nebulization for wheezing or shortness of breath. 75 mL 11  . albuterol (VENTOLIN HFA) 108 (90 Base) MCG/ACT inhaler INHALE 2 PUFFS INTO THE LUNGS EVERY 6 HOURS AS NEEDED FOR WHEEZING OR SHORTNESS OF BREATH 180 g 3  . cetirizine (ZYRTEC) 10 MG tablet Take 1 tablet (10 mg total) daily by mouth. 30 tablet 11  . fluticasone (FLONASE) 50 MCG/ACT nasal spray Place 2 sprays daily into both nostrils. 16 g 11  . montelukast (SINGULAIR) 10 MG tablet Take 1 tablet (10 mg total) at bedtime by mouth. 30 tablet 11  . predniSONE (DELTASONE) 20 MG tablet Take 2 tablets (40 mg total) daily with breakfast by mouth. For 5 days. (Patient not taking: Reported on 03/07/2018) 10 tablet 0   No facility-administered medications prior to visit.     Allergies  Allergen Reactions  . Penicillins Itching       Objective:    BP 117/74 (BP Location: Left Arm, Patient Position: Sitting, Cuff Size: Large)   Pulse 81   Temp 99.4 F (37.4 C) (Oral)   Ht 5\' 9"  (1.753 m)   Wt 256 lb 12.8 oz (116.5 kg)   SpO2 93%   BMI 37.92 kg/m  Wt Readings from Last 3 Encounters:  03/07/18 256 lb 12.8 oz (116.5 kg)  08/21/17 257 lb 3.2 oz (116.7 kg)  07/10/17 242 lb 12.8 oz (110.1 kg)    Physical Exam  Constitutional: He is oriented to person, place, and time. He appears well-developed and well-nourished. He is cooperative.  HENT:  Head: Normocephalic and atraumatic.  Eyes: EOM are normal.  Neck: Normal range of motion.  Cardiovascular: Normal rate, regular rhythm and normal heart sounds. Exam reveals no gallop and no friction rub.  No murmur heard. Pulmonary/Chest: Effort normal and  breath sounds normal. No tachypnea. No respiratory distress. He has no decreased breath sounds. He has no wheezes. He has no rhonchi. He has no rales. He exhibits no tenderness.  Abdominal: Bowel sounds are normal.  Musculoskeletal: Normal range of motion. He exhibits no edema.  Neurological: He is alert and oriented to person, place, and time. Coordination normal.  Skin: Skin is warm and dry.  Psychiatric: He has a normal mood and affect. His behavior is normal. Judgment and thought content normal.  Nursing note and vitals reviewed.     Patient has been counseled extensively about nutrition and exercise as well as the importance of adherence with medications and regular follow-up. The patient was given clear instructions to go to ER or return to medical center if symptoms don't improve, worsen or new problems develop. The patient verbalized understanding.   Follow-up: Return for Physical ONLY no labs.   Gildardo Pounds, FNP-BC [place]  [facility] and [place], [place]   03/07/2018, 3:44 PM

## 2018-03-07 NOTE — Patient Instructions (Signed)
Prediabetes Prediabetes is the condition of having a blood sugar (blood glucose) level that is higher than it should be, but not high enough for you to be diagnosed with type 2 diabetes. Having prediabetes puts you at risk for developing type 2 diabetes (type 2 diabetes mellitus). Prediabetes may be called impaired glucose tolerance or impaired fasting glucose. Prediabetes usually does not cause symptoms. Your health care provider can diagnose this condition with blood tests. You may be tested for prediabetes if you are overweight and if you have at least one other risk factor for prediabetes. Risk factors for prediabetes include:  Having a family member with type 2 diabetes.  Being overweight or obese.  Being older than age 57.  Being of American-Indian, African-American, Hispanic/Latino, or Asian/Pacific Islander descent.  Having an inactive (sedentary) lifestyle.  Having a history of gestational diabetes or polycystic ovarian syndrome (PCOS).  Having low levels of good cholesterol (HDL-C) or high levels of blood fats (triglycerides).  Having high blood pressure.  What is blood glucose and how is blood glucose measured?  Blood glucose refers to the amount of glucose in your bloodstream. Glucose comes from eating foods that contain sugars and starches (carbohydrates) that the body breaks down into glucose. Your blood glucose level may be measured in mg/dL (milligrams per deciliter) or mmol/L (millimoles per liter).Your blood glucose may be checked with one or more of the following blood tests:  A fasting blood glucose (FBG) test. You will not be allowed to eat (you will fast) for at least 8 hours before a blood sample is taken. ? A normal range for FBG is 70-100 mg/dl (3.9-5.6 mmol/L).  An A1c (hemoglobin A1c) blood test. This test provides information about blood glucose control over the previous 2?68month.  An oral glucose tolerance test (OGTT). This test measures your blood  glucose twice: ? After fasting. This is your baseline level. ? Two hours after you drink a beverage that contains glucose.  You may be diagnosed with prediabetes:  If your FBG is 100?125 mg/dL (5.6-6.9 mmol/L).  If your A1c level is 5.7?6.4%.  If your OGGT result is 140?199 mg/dL (7.8-11 mmol/L).  These blood tests may be repeated to confirm your diagnosis. What happens if blood glucose is too high? The pancreas produces a hormone (insulin) that helps move glucose from the bloodstream into cells. When cells in the body do not respond properly to insulin that the body makes (insulin resistance), excess glucose builds up in the blood instead of going into cells. As a result, high blood glucose (hyperglycemia) can develop, which can cause many complications. This is a symptom of prediabetes. What can happen if blood glucose stays higher than normal for a long time? Having high blood glucose for a long time is dangerous. Too much glucose in your blood can damage your nerves and blood vessels. Long-term damage can lead to complications from diabetes, which may include:  Heart disease.  Stroke.  Blindness.  Kidney disease.  Depression.  Poor circulation in the feet and legs, which could lead to surgical removal (amputation) in severe cases.  How can prediabetes be prevented from turning into type 2 diabetes?  To help prevent type 2 diabetes, take the following actions:  Be physically active. ? Do moderate-intensity physical activity for at least 30 minutes on at least 5 days of the week, or as much as told by your health care provider. This could be brisk walking, biking, or water aerobics. ? Ask your health care provider what  activities are safe for you. A mix of physical activities may be best, such as walking, swimming, cycling, and strength training.  Lose weight as told by your health care provider. ? Losing 5-7% of your body weight can reverse insulin resistance. ? Your health  care provider can determine how much weight loss is best for you and can help you lose weight safely.  Follow a healthy meal plan. This includes eating lean proteins, complex carbohydrates, fresh fruits and vegetables, low-fat dairy products, and healthy fats. ? Follow instructions from your health care provider about eating or drinking restrictions. ? Make an appointment to see a diet and nutrition specialist (registered dietitian) to help you create a healthy eating plan that is right for you.  Do not smoke or use any tobacco products, such as cigarettes, chewing tobacco, and e-cigarettes. If you need help quitting, ask your health care provider.  Take over-the-counter and prescription medicines as told by your health care provider. You may be prescribed medicines that help lower the risk of type 2 diabetes.  This information is not intended to replace advice given to you by your health care provider. Make sure you discuss any questions you have with your health care provider. Document Released: 12/14/2015 Document Revised: 01/28/2016 Document Reviewed: 10/13/2015 Elsevier Interactive Patient Education  2018 Elsevier Inc.  

## 2018-03-08 LAB — CBC
HEMOGLOBIN: 15.4 g/dL (ref 13.0–17.7)
Hematocrit: 46.4 % (ref 37.5–51.0)
MCH: 27.3 pg (ref 26.6–33.0)
MCHC: 33.2 g/dL (ref 31.5–35.7)
MCV: 82 fL (ref 79–97)
PLATELETS: 248 10*3/uL (ref 150–450)
RBC: 5.64 x10E6/uL (ref 4.14–5.80)
RDW: 14.4 % (ref 12.3–15.4)
WBC: 9.4 10*3/uL (ref 3.4–10.8)

## 2018-03-08 LAB — BASIC METABOLIC PANEL
BUN/Creatinine Ratio: 21 — ABNORMAL HIGH (ref 9–20)
BUN: 15 mg/dL (ref 6–20)
CALCIUM: 9.6 mg/dL (ref 8.7–10.2)
CHLORIDE: 103 mmol/L (ref 96–106)
CO2: 23 mmol/L (ref 20–29)
Creatinine, Ser: 0.72 mg/dL — ABNORMAL LOW (ref 0.76–1.27)
GFR calc Af Amer: 141 mL/min/{1.73_m2} (ref >59)
GFR calc non Af Amer: 122 mL/min/{1.73_m2} (ref >59)
GLUCOSE: 85 mg/dL (ref 65–99)
POTASSIUM: 4.2 mmol/L (ref 3.5–5.2)
SODIUM: 141 mmol/L (ref 134–144)

## 2018-03-08 LAB — LIPID PANEL
CHOL/HDL RATIO: 4.3 ratio (ref 0.0–5.0)
CHOLESTEROL TOTAL: 187 mg/dL (ref 100–199)
HDL: 44 mg/dL (ref >39)
LDL Calculated: 97 mg/dL (ref 0–99)
TRIGLYCERIDES: 228 mg/dL — AB (ref 0–149)
VLDL Cholesterol Cal: 46 mg/dL — ABNORMAL HIGH (ref 5–40)

## 2018-03-16 ENCOUNTER — Telehealth: Payer: Self-pay

## 2018-03-16 NOTE — Telephone Encounter (Signed)
-----   Message from Gildardo Pounds, NP sent at 03/12/2018  9:25 PM EDT ----- Kidney function is normal. There is no anemia. Tests show increased cholesterol/lipid levels. Work on eating a low fat, heart healthy diet and participate in regular aerobic exercise program to control as well. Exercise at least  30 minutes per day-5 days per week. Avoid red meat. No fried foods. No junk foods, sodas, sugary foods or drinks, unhealthy snacking, alcohol or smoking.

## 2018-03-16 NOTE — Telephone Encounter (Signed)
CMA attempt to call patient to inform on lab results.  No answer and left a VM for patient to call back.  If patient call back, please inform:  Kidney function is normal. There is no anemia. Tests show increased cholesterol/lipid levels. Work on eating a low fat, heart healthy diet and participate in regular aerobic exercise program to control as well. Exercise at least  30 minutes per day-5 days per week. Avoid red meat. No fried foods. No junk foods, sodas, sugary foods or drinks, unhealthy snacking, alcohol or smoking.

## 2018-04-09 ENCOUNTER — Ambulatory Visit: Payer: Self-pay | Attending: Nurse Practitioner | Admitting: Nurse Practitioner

## 2018-04-09 ENCOUNTER — Encounter: Payer: Self-pay | Admitting: Nurse Practitioner

## 2018-04-09 VITALS — BP 124/79 | HR 93 | Temp 99.0°F | Ht 69.0 in | Wt 260.8 lb

## 2018-04-09 DIAGNOSIS — R7303 Prediabetes: Secondary | ICD-10-CM | POA: Insufficient documentation

## 2018-04-09 DIAGNOSIS — E669 Obesity, unspecified: Secondary | ICD-10-CM | POA: Insufficient documentation

## 2018-04-09 DIAGNOSIS — Z880 Allergy status to penicillin: Secondary | ICD-10-CM | POA: Insufficient documentation

## 2018-04-09 DIAGNOSIS — Z79899 Other long term (current) drug therapy: Secondary | ICD-10-CM | POA: Insufficient documentation

## 2018-04-09 DIAGNOSIS — Z6839 Body mass index (BMI) 39.0-39.9, adult: Secondary | ICD-10-CM | POA: Insufficient documentation

## 2018-04-09 DIAGNOSIS — Z0000 Encounter for general adult medical examination without abnormal findings: Secondary | ICD-10-CM | POA: Insufficient documentation

## 2018-04-09 DIAGNOSIS — J45909 Unspecified asthma, uncomplicated: Secondary | ICD-10-CM | POA: Insufficient documentation

## 2018-04-09 NOTE — Progress Notes (Signed)
Assessment & Plan:  Nicholas Sanford was seen today for annual exam.  Diagnoses and all orders for this visit:  Annual physical exam F/U in 6 months for prediabetes  Obesity (BMI 30-39.9) Discussed diet and exercise for person with BMI >38. Instructed: You must burn more calories than you eat. Losing 5 percent of your body weight should be considered a success. In the longer term, losing more than 15 percent of your body weight and staying at this weight is an extremely good result. However, keep in mind that even losing 5 percent of your body weight leads to important health benefits, so try not to get discouraged if you're not able to lose more than this. Will recheck weight in 3-6 months.  Patient has been counseled on age-appropriate routine health concerns for screening and prevention. These are reviewed and up-to-date. Referrals have been placed accordingly. Immunizations are up-to-date or declined.    Subjective:   Chief Complaint  Patient presents with  . Annual Exam    Pt. is here for physical.    HPI Nicholas Sanford 32 y.o. male presents to office today for physical exam. VRI was used to communicate directly with patient for the entire encounter including providing detailed patient instructions.    Review of Systems  Constitutional: Negative for fever, malaise/fatigue and weight loss.  HENT: Negative.  Negative for nosebleeds.   Eyes: Negative.  Negative for blurred vision, double vision and photophobia.  Respiratory: Negative.  Negative for cough and shortness of breath.   Cardiovascular: Negative.  Negative for chest pain, palpitations and leg swelling.  Gastrointestinal: Negative.  Negative for heartburn, nausea and vomiting.  Genitourinary: Negative.   Musculoskeletal: Negative.  Negative for myalgias.  Skin: Negative.   Neurological: Negative.  Negative for dizziness, focal weakness, seizures and headaches.  Endo/Heme/Allergies: Negative.   Psychiatric/Behavioral:  Negative.  Negative for suicidal ideas.    Past Medical History:  Diagnosis Date  . Allergy    sesonal   . Asthma    as a child     Past Surgical History:  Procedure Laterality Date  . APPENDECTOMY  2014  . LAPAROSCOPIC APPENDECTOMY N/A 08/11/2013   Procedure: APPENDECTOMY LAPAROSCOPIC;  Surgeon: Shann Medal, MD;  Location: [facility];  Service: General;  Laterality: N/A;    Family History  Problem Relation Age of Onset  . Asthma Mother     Social History Reviewed with no changes to be made today.   Outpatient Medications Prior to Visit  Medication Sig Dispense Refill  . ADVAIR DISKUS 250-50 MCG/DOSE AEPB Inhale 1 puff into the lungs 2 (two) times daily. 180 each 3  . albuterol (PROVENTIL) (2.5 MG/3ML) 0.083% nebulizer solution Take 6 mLs (5 mg total) every 4 (four) hours as needed by nebulization for wheezing or shortness of breath. 75 mL 11  . albuterol (VENTOLIN HFA) 108 (90 Base) MCG/ACT inhaler INHALE 2 PUFFS INTO THE LUNGS EVERY 6 HOURS AS NEEDED FOR WHEEZING OR SHORTNESS OF BREATH 180 g 3  . cetirizine (ZYRTEC) 10 MG tablet Take 1 tablet (10 mg total) daily by mouth. 30 tablet 11  . fluticasone (FLONASE) 50 MCG/ACT nasal spray Place 2 sprays daily into both nostrils. 16 g 11  . montelukast (SINGULAIR) 10 MG tablet Take 1 tablet (10 mg total) by mouth at bedtime. 30 tablet 11   No facility-administered medications prior to visit.     Allergies  Allergen Reactions  . Penicillins Itching       Objective:  BP 124/79 (BP Location: Left Arm, Patient Position: Sitting, Cuff Size: Large)   Pulse 93   Temp 99 F (37.2 C) (Oral)   Ht 5\' 9"  (1.753 m)   Wt 260 lb 12.8 oz (118.3 kg)   SpO2 93%   BMI 38.51 kg/m  Wt Readings from Last 3 Encounters:  04/09/18 260 lb 12.8 oz (118.3 kg)  03/07/18 256 lb 12.8 oz (116.5 kg)  08/21/17 257 lb 3.2 oz (116.7 kg)    Physical Exam  Constitutional: He is oriented to person, place, and time. He appears well-developed and  well-nourished.  HENT:  Head: Normocephalic and atraumatic.  Right Ear: Hearing, tympanic membrane, external ear and ear canal normal.  Left Ear: Hearing, tympanic membrane, external ear and ear canal normal.  Nose: Nose normal. No mucosal edema or rhinorrhea.  Mouth/Throat: Uvula is midline, oropharynx is clear and moist and mucous membranes are normal. Tonsils are 2+ on the right. Tonsils are 2+ on the left. No tonsillar exudate.  Eyes: Pupils are equal, round, and reactive to light. Conjunctivae, EOM and lids are normal. No scleral icterus.  Neck: Normal range of motion. Neck supple. No tracheal deviation present. No thyromegaly present.  Cardiovascular: Normal rate, regular rhythm, normal heart sounds and intact distal pulses. Exam reveals no gallop and no friction rub.  No murmur heard. Pulses:      Dorsalis pedis pulses are 2+ on the right side, and 2+ on the left side.       Posterior tibial pulses are 2+ on the right side, and 2+ on the left side.  Pulmonary/Chest: Effort normal. No respiratory distress. He has wheezes. He has no rales. He exhibits no mass and no tenderness. Right breast exhibits no inverted nipple, no mass, no nipple discharge, no skin change and no tenderness. Left breast exhibits no inverted nipple, no mass, no nipple discharge, no skin change and no tenderness.  Abdominal: Soft. Bowel sounds are normal. He exhibits distension. He exhibits no mass. There is no tenderness. There is no rebound and no guarding. Hernia confirmed negative in the right inguinal area and confirmed negative in the left inguinal area.  Genitourinary: Testes normal and penis normal. Right testis shows no mass, no swelling and no tenderness. Right testis is descended. Cremasteric reflex is not absent on the right side. Left testis shows no mass, no swelling and no tenderness. Left testis is descended. Cremasteric reflex is not absent on the left side. Uncircumcised.  Musculoskeletal: Normal range of  motion. He exhibits no edema, tenderness or deformity.  Feet:  Right Foot:  Skin Integrity: Positive for callus and dry skin.  Left Foot:  Skin Integrity: Positive for callus and dry skin.  Lymphadenopathy:    He has no cervical adenopathy. No inguinal adenopathy noted on the right or left side.       Right: No inguinal adenopathy present.       Left: No inguinal adenopathy present.  Neurological: He is alert and oriented to person, place, and time. He has normal strength. He displays normal reflexes. No cranial nerve deficit or sensory deficit. He exhibits normal muscle tone. He displays a negative Romberg sign. Coordination and gait normal.  Skin: Skin is warm and dry. Capillary refill takes less than 2 seconds. No erythema.  Multiple skin tags on neck   Psychiatric: He has a normal mood and affect. His behavior is normal. Judgment and thought content normal.         Patient has been counseled extensively  about nutrition and exercise as well as the importance of adherence with medications and regular follow-up. The patient was given clear instructions to go to ER or return to medical center if symptoms don't improve, worsen or new problems develop. The patient verbalized understanding.   Follow-up: Return in about 6 months (around 10/10/2018).   Gildardo Pounds, FNP-BC [facility] and [place], [place]   04/09/2018, 2:46 PM

## 2018-04-09 NOTE — Patient Instructions (Signed)
Mantenimiento de Scientist, forensic hombres (Health Maintenance, Male) Un estilo de vida saludable y los cuidados preventivos son importantes para la salud y Musician. Pregntele al mdico cul es el cronograma de exmenes peridicos adecuado para usted. [place]? Consuma una dieta saludable  Coma muchas verduras, frutas, cereales integrales, productos lcteos con bajo contenido de grasa y Advertising account planner.  No consuma muchos alimentos de alto contenido de grasas slidas, azcares agregados o sal. Mantenga un peso saludable La actividad fsica habitual puede ayudarlo a Science writer o mantener un peso saludable. Deber hacer lo siguiente:  Realizar al menos 152minutos de actividad fsica por semana. El ejercicio debe aumentar la frecuencia cardaca y Actor la transpiracin (ejercicio de [place]).  Hacer ejercicios de entrenamiento de fuerza por lo [employer] por semana. Controlarse los niveles de colesterol y lpidos en la sangre  Hgase anlisis de sangre para controlar los lpidos y el colesterol cada 5aos a partir de los 35aos. Si tiene un riesgo alto de Best boy cardiopatas coronarias, debe comenzar a [employer] de [place] a los [place]. Es posible que Automotive engineer los niveles de colesterol con mayor frecuencia si: ? Sus niveles de lpidos y colesterol son altos. ? Es mayor de [id number]. ? Tiene un riesgo alto de tener cardiopatas coronarias. QU DEBO SABER SOBRE LAS PRUEBAS DE [place]? Muchos tipos de cncer se pueden detectar de manera temprana y a menudo prevenirse. Cncer de pulmn  Debe someterse a pruebas de deteccin de cncer de pulmn todos los aos en los siguientes casos: ? Si fuma actualmente y lo ha hecho durante por lo menos 30aos. ? Si fue fumador que dej el hbito en el trmino de los ltimos 15aos.  Hable con el mdico sobre las opciones en relacin con los estudios de deteccin, cundo  debe comenzar a Insurance underwriter y con Armed forces operational officer. Cncer colorrectal  Generalmente, las pruebas de deteccin habituales del cncer colorrectal comienzan a los 50aos y deben repetirse cada 5 a 10aos hasta los 75aos. Es posible que tenga que hacerse las pruebas con mayor frecuencia si se detectan formas tempranas de plipos precancerosos o pequeos bultos. Sin embargo, el mdico podr aconsejarle que lo haga antes, si tiene factores de riesgo para el cncer de colon.  El mdico puede recomendarle que use kits de prueba caseros para Publishing rights manager oculta en la materia fecal.  Se puede usar una pequea cmara en el extremo de un tubo para examinar el colon (sigmoidoscopia o colonoscopia). Este estudio [employer] formas ms tempranas de Surveyor, minerals. Cncer de prstata y de testculo  En funcin de la edad y del [employer] de salud general, el mdico puede realizarle determinados estudios de deteccin del cncer de prstata y de testculo.  Hable con el mdico sobre cualquier sntoma o acerca de las inquietudes que tenga sobre el cncer de prstata o de testculo. Cncer de piel  Revise la piel de la cabeza a los pies con regularidad.  Informe al mdico si aparecen nuevos lunares o si nota cambios en los que ya tiene, especialmente en estos casos: ? Si hay un cambio en el tamao, la forma o el color del lunar. ? Si tiene un lunar que es ms grande que el tamao de una goma de Games developer.  Siempre use pantalla solar. Aplquese pantalla solar de Lanell Matar y repetida a lo largo del Training and development officer.  Use mangas y [employer], un sombrero de ala ancha y gafas para  el sol cuando est al Alexis Goodell, para protegerse. QU DEBO SABER SOBRE LAS CARDIOPATAS CORONARIAS, LA DIABETES Y LA HIPERTENSIN ARTERIAL?  Si usted tiene entre 18 y 88aos, debe medirse la presin arterial cada 3a 5aos. Si usted tiene 40aos o ms, debe medirse la presin arterial [employer]. Debe medirse la presin arterial  dos veces: una vez cuando est en un hospital o una clnica y la otra vez cuando est en otro sitio. Registre el promedio de [employer]. Para controlar su presin arterial cuando no est en un hospital o [place], puede usar lo siguiente: ? Jorje Guild automtica para medir la presin arterial en una farmacia. ? Un monitor para medir la presin arterial en el hogar.  Hable con el mdico [employer] ideales de la presin arterial.  Si tiene entre 9 y 79aos, consltele al mdico si debe tomar aspirina para evitar las cardiopatas coronarias.  Hgase anlisis habituales de deteccin de la diabetes; para ello, contrlese la glucemia en ayunas. ? Si su peso es normal y tiene un bajo riesgo de padecer diabetes, realcese este anlisis cada tres aos despus de los 45aos. ? Si tiene sobrepeso y un alto riesgo de padecer diabetes, considere someterse a este anlisis antes o con mayor frecuencia.  Para los hombres que tienen entre 43 y 27aos, y son o han sido fumadores, se recomienda un nico estudio con ecografa para Hydrographic surveyor un aneurisma de aorta abdominal (AAA). QU DEBO SABER SOBRE LA PREVENCIN DE LAS INFECCIONES? HepatitisB Si tiene un riesgo ms alto de Museum/gallery curator hepatitis B, debe someterse a un examen de deteccin de este virus. Hable con el mdico para determinar si corre riesgo de tener una infeccin por hepatitisB. Hepatitis C Se recomienda un anlisis de [place] para:  Todos los que nacieron entre 1947 y [phone].  Todas las personas que tengan un riesgo de haber contrado hepatitis C. Enfermedades de transmisin sexual (ETS)  Debe realizarse pruebas de Programme researcher, broadcasting/film/video de las ETS todos los aos, incluidas la gonorrea y la clamidia, en estos casos: ? Es sexualmente activo y es menor de [place]. ? Es mayor de 24aos, y Investment banker, operational informa que corre riesgo de tener este tipo de infecciones. ? La actividad sexual ha cambiado desde que le hicieron la ltima prueba de  deteccin y tiene un riesgo mayor de Best boy clamidia o Radio broadcast assistant. Pregntele al mdico si usted tiene riesgo.  Consulte a su mdico para saber si tiene un alto riesgo de infectarse por el VIH. El mdico puede recomendarle un medicamento de venta con receta para ayudar a evitar la infeccin por el VIH. QU MS PUEDO HACER?  Realcese los estudios de rutina de la salud, dentales y de Public librarian.  [place] (inmunizaciones).  No consuma ningn producto que contenga tabaco, lo que incluye cigarrillos, tabaco de Higher education careers adviser y Psychologist, sport and exercise. Si necesita ayuda para dejar de fumar, consulte al mdico.  Limite el consumo de alcohol a no ms de 71medidas por da. [employer] a 12 onzas de cerveza, 5onzas de vino o 1onzas de bebidas alcohlicas de alta graduacin.  No consuma drogas.  No comparta agujas.  Solicite ayuda a su mdico si necesita apoyo o informacin para abandonar las drogas.  Informe a su mdico si a menudo se siente deprimido.  Notifique a su mdico si alguna vez ha sido vctima de abuso o si no se siente seguro en su hogar. Esta informacin no tiene Psychologist, clinical  consejo del mdico. Asegrese de hacerle al mdico cualquier pregunta que tenga. Document Released: 02/18/2008 Document Revised: 09/12/2014 Document Reviewed: 05/26/2015 Elsevier Interactive Patient Education  [employer].

## 2018-05-14 MED FILL — $VENTOLIN HFA 18G INHALER: 108 (90 BAS | 75 days supply | Qty: 54 | Fill #2

## 2018-05-14 MED FILL — FLUTICASONE PROP 50 MCG SPR: 50 | 30 days supply | Qty: 16 | Fill #2

## 2018-05-14 MED FILL — ?MONTELUKAST SODI 10MG TAB: 10 | 30 days supply | Qty: 30 | Fill #3

## 2018-05-14 MED FILL — $ADVAIR 250/50 MCG INHALER: 250-50 | 90 days supply | Qty: 180 | Fill #2

## 2018-05-14 MED FILL — ?CETIRIZINE HCL 10 MG TABLE: 10 | 30 days supply | Qty: 30 | Fill #2

## 2018-06-26 MED FILL — ?MONTELUKAST SODI 10MG TAB: 10 | 30 days supply | Qty: 30 | Fill #4

## 2018-06-26 MED FILL — FLUTICASONE PROP 50 MCG SPR: 50 | 30 days supply | Qty: 16 | Fill #3

## 2018-07-31 MED FILL — $VENTOLIN HFA 18G INHALER: 108 (90 BAS | 75 days supply | Qty: 54 | Fill #3

## 2018-08-06 ENCOUNTER — Other Ambulatory Visit: Payer: Self-pay

## 2018-08-06 ENCOUNTER — Emergency Department (HOSPITAL_COMMUNITY)
Admission: EM | Admit: 2018-08-06 | Discharge: 2018-08-07 | Discharge status: Against Medical Advice | Payer: Self-pay | Attending: Emergency Medicine | Admitting: Emergency Medicine

## 2018-08-06 ENCOUNTER — Encounter (HOSPITAL_COMMUNITY): Payer: Self-pay | Admitting: *Deleted

## 2018-08-06 DIAGNOSIS — Z5321 Procedure and treatment not carried out due to patient leaving prior to being seen by health care provider: Secondary | ICD-10-CM | POA: Insufficient documentation

## 2018-08-06 MED ORDER — ALBUTEROL SULFATE (2.5 MG/3ML) 0.083% IN NEBU
5.0000 mg | INHALATION_SOLUTION | Freq: Once | RESPIRATORY_TRACT | Status: AC
  Administered 2018-08-06: 5 mg via RESPIRATORY_TRACT
  Filled 2018-08-06: qty 6

## 2018-08-06 NOTE — ED Triage Notes (Signed)
Pt says he has asthma and he has not been feeling well for two weeks. Also has nasal congestion, has been using Flonase for the same. He has been using his inhaler but it is not helping.

## 2018-08-07 NOTE — ED Notes (Signed)
Pt informed registration that he was leaving.

## 2018-10-15 ENCOUNTER — Ambulatory Visit: Payer: Self-pay

## 2018-11-12 ENCOUNTER — Encounter: Payer: Self-pay | Admitting: Nurse Practitioner

## 2018-11-12 ENCOUNTER — Telehealth: Payer: Self-pay

## 2018-11-12 ENCOUNTER — Ambulatory Visit: Payer: Self-pay | Attending: Nurse Practitioner | Admitting: Nurse Practitioner

## 2018-11-12 DIAGNOSIS — F419 Anxiety disorder, unspecified: Secondary | ICD-10-CM

## 2018-11-12 DIAGNOSIS — L639 Alopecia areata, unspecified: Secondary | ICD-10-CM

## 2018-11-12 DIAGNOSIS — J4530 Mild persistent asthma, uncomplicated: Secondary | ICD-10-CM

## 2018-11-12 MED ORDER — ADVAIR DISKUS 250-50 MCG/DOSE IN AEPB: 1.0000 | INHALATION_SPRAY | Freq: Two times a day (BID) | RESPIRATORY_TRACT | 3 refills | Status: DC

## 2018-11-12 MED ORDER — BETAMETHASONE VALERATE 0.12 % EX FOAM: 1.0000 "application " | Freq: Two times a day (BID) | CUTANEOUS | 1 refills | Status: AC

## 2018-11-12 MED ORDER — ALBUTEROL SULFATE (2.5 MG/3ML) 0.083% IN NEBU: 5.0000 mg | INHALATION_SOLUTION | RESPIRATORY_TRACT | 11 refills | Status: DC | PRN

## 2018-11-12 MED ORDER — BUSPIRONE HCL 10 MG PO TABS: 10.0000 mg | ORAL_TABLET | Freq: Two times a day (BID) | ORAL | 1 refills | Status: AC

## 2018-11-12 MED ORDER — CETIRIZINE HCL 10 MG PO TABS: 10.0000 mg | ORAL_TABLET | Freq: Every day | ORAL | 11 refills | Status: DC

## 2018-11-12 MED ORDER — MONTELUKAST SODIUM 10 MG PO TABS: 10.0000 mg | ORAL_TABLET | Freq: Every day | ORAL | 11 refills | Status: DC

## 2018-11-12 MED ORDER — ALBUTEROL SULFATE HFA 108 (90 BASE) MCG/ACT IN AERS: INHALATION_SPRAY | RESPIRATORY_TRACT | 2 refills | Status: DC

## 2018-11-12 MED FILL — ALBUTEROL SUL 2.5 MG/3 ML S: (2.5 MG/3ML | 2 days supply | Qty: 75 | Fill #0

## 2018-11-12 MED FILL — MONTELUKAST SOD 10 MG TAB: 10 | 30 days supply | Qty: 30 | Fill #0

## 2018-11-12 MED FILL — $ADVAIR 250/50 MCG INHALER: 250-50 | 90 days supply | Qty: 180 | Fill #0

## 2018-11-12 MED FILL — busPIRone HCL 10 MG TABS: 10 | 30 days supply | Qty: 60 | Fill #0

## 2018-11-12 MED FILL — $VENTOLIN HFA 18G INHALER: 108 (90 BAS | 75 days supply | Qty: 54 | Fill #0

## 2018-11-12 NOTE — Progress Notes (Signed)
Assessment & Plan:  Nicholas Sanford was seen today for follow-up.  Diagnoses and all orders for this visit:  Severe obesity (BMI 35.0-39.9) with comorbidity (HCC) -     Lipid panel -     CMP14+EGFR -     TSH Discussed diet and exercise for person with BMI >25. Instructed: You must burn more calories than you eat. Losing 5 percent of your body weight should be considered a success. In the longer term, losing more than 15 percent of your body weight and staying at this weight is an extremely good result. However, keep in mind that even losing 5 percent of your body weight leads to important health benefits, so try not to get discouraged if you're not able to lose more than this. Will recheck weight in 3-6 months.  Alopecia areata -     CMP14+EGFR -     CBC -     Betamethasone Valerate 0.12 % foam; Apply 1 application topically 2 (two) times daily for 30 days.  Anxiety -     busPIRone (BUSPAR) 10 MG tablet; Take 1 tablet (10 mg total) by mouth 2 (two) times daily for 30 days.  Mild persistent asthma without complication -     cetirizine (ZYRTEC) 10 MG tablet; Take 1 tablet (10 mg total) by mouth daily. -     albuterol (VENTOLIN HFA) 108 (90 Base) MCG/ACT inhaler; INHALE 2 PUFFS INTO THE LUNGS EVERY 6 HOURS AS NEEDED FOR WHEEZING OR SHORTNESS OF BREATH -     montelukast (SINGULAIR) 10 MG tablet; Take 1 tablet (10 mg total) by mouth at bedtime. -     albuterol (PROVENTIL) (2.5 MG/3ML) 0.083% nebulizer solution; Take 6 mLs (5 mg total) by nebulization every 4 (four) hours as needed for wheezing or shortness of breath. -     busPIRone (BUSPAR) 10 MG tablet; Take 1 tablet (10 mg total) by mouth 2 (two) times daily for 30 days. -     ADVAIR DISKUS 250-50 MCG/DOSE AEPB; Inhale 1 puff into the lungs 2 (two) times daily. Asthma Action Plan discussed    Patient has been counseled on age-appropriate routine health concerns for screening and prevention. These are reviewed and up-to-date. Referrals have been  placed accordingly. Immunizations are up-to-date or declined.    Subjective:   Chief Complaint  Patient presents with  . Follow-up    Pt. is here for 6 months follow-up.   HPI Nicholas Sanford 33 y.o. male presents to office today for follow up. We discussed dietary and weight modifications as he is concerned about difficulty losing weight over the past few months.   Alopecia: Patient complains of hair loss.  The hair loss is localized in distribution, with onset approximately a few months ago. Patient describes symptoms of hair breaking and hair thinning. Patient denies scalp itch, scalp pain, scalp rash/ lesions, scalp redness, scalp scaling and scalp tenderness. Patient does not have family history of hair loss.  Patient does not have dietary restrictions. Patient does not wear a high tension hair style however he does wear baseball caps. Patient does not have a serious medical illnesses or major weight loss during time of hair loss. He does endorse increased stress in his life but declines details.   Depression screen [facility] 2/9 11/12/2018 04/09/2018 03/07/2018 08/21/2017 07/10/2017  Decreased Interest 0 0 0 0 0  Down, Depressed, Hopeless 1 0 0 0 0  PHQ - 2 Score 1 0 0 0 0  Altered sleeping 0 0 0 0  0  Tired, decreased energy 0 3 0 3 1  Change in appetite 0 2 0 0 0  Feeling bad or failure about yourself  0 1 0 0 0  Trouble concentrating 1 1 0 0 2  Moving slowly or fidgety/restless 0 0 0 0 0  Suicidal thoughts 0 1 0 0 0  PHQ-9 Score 2 8 0 3 3   GAD 7 : Generalized Anxiety Score 11/12/2018 04/09/2018 03/07/2018 08/21/2017  Nervous, Anxious, on Edge 0 0 1 0  Control/stop worrying 0 0 0 0  Worry too much - different things 0 1 0 0  Trouble relaxing 0 2 1 0  Restless 0 0 0 0  Easily annoyed or irritable 1 1 0 0  Afraid - awful might happen 2 2 0 0  Total GAD 7 Score _0 0    Asthma Chronic since childhood. Well controlled. Denies overuse of SABA or nebs. Taking singulair and using advair as  prescribed. Denies cough, wheezing or shortness of breath.    Review of Systems  Constitutional: Negative for fever, malaise/fatigue and weight loss.  HENT: Negative.  Negative for nosebleeds.   Eyes: Negative.  Negative for blurred vision, double vision and photophobia.  Respiratory: Negative.  Negative for cough and shortness of breath.   Cardiovascular: Negative.  Negative for chest pain, palpitations and leg swelling.  Gastrointestinal: Negative.  Negative for heartburn, nausea and vomiting.  Musculoskeletal: Negative.  Negative for myalgias.  Skin:       HAIR LOSS  Neurological: Negative.  Negative for dizziness, focal weakness, seizures and headaches.  Endo/Heme/Allergies: Positive for environmental allergies.  Psychiatric/Behavioral: Negative for suicidal ideas. The patient is nervous/anxious.     Past Medical History:  Diagnosis Date  . Allergy    sesonal   . Asthma    as a child     Past Surgical History:  Procedure Laterality Date  . APPENDECTOMY  2014  . LAPAROSCOPIC APPENDECTOMY N/A 08/11/2013   Procedure: APPENDECTOMY LAPAROSCOPIC;  Surgeon: Shann Medal, MD;  Location: [facility];  Service: General;  Laterality: N/A;    Family History  Problem Relation Age of Onset  . Asthma Mother     Social History Reviewed with no changes to be made today.   Outpatient Medications Prior to Visit  Medication Sig Dispense Refill  . fluticasone (FLONASE) 50 MCG/ACT nasal spray Place 2 sprays daily into both nostrils. 16 g 11  . ADVAIR DISKUS 250-50 MCG/DOSE AEPB Inhale 1 puff into the lungs 2 (two) times daily. 180 each 3  . albuterol (PROVENTIL) (2.5 MG/3ML) 0.083% nebulizer solution Take 6 mLs (5 mg total) every 4 (four) hours as needed by nebulization for wheezing or shortness of breath. 75 mL 11  . albuterol (VENTOLIN HFA) 108 (90 Base) MCG/ACT inhaler INHALE 2 PUFFS INTO THE LUNGS EVERY 6 HOURS AS NEEDED FOR WHEEZING OR SHORTNESS OF BREATH 180 g 3  . cetirizine (ZYRTEC)  10 MG tablet Take 1 tablet (10 mg total) daily by mouth. 30 tablet 11  . montelukast (SINGULAIR) 10 MG tablet Take 1 tablet (10 mg total) by mouth at bedtime. 30 tablet 11   No facility-administered medications prior to visit.     Allergies  Allergen Reactions  . Penicillins Itching       Objective:    BP 119/78 (BP Location: Left Arm, Patient Position: Sitting, Cuff Size: Large)   Pulse 79   Temp 99.2 F (37.3 C) (Oral)   Ht _1  (  1.753 m)   Wt 264 lb (119.7 kg)   SpO2 97%   BMI 38.99 kg/m  Wt Readings from Last 3 Encounters:  11/12/18 264 lb (119.7 kg)  08/06/18 256 lb (116.1 kg)  04/09/18 260 lb 12.8 oz (118.3 kg)    Physical Exam Vitals signs and nursing note reviewed.  Constitutional:      Appearance: He is well-developed.  HENT:     Head: Normocephalic and atraumatic.   Neck:     Musculoskeletal: Normal range of motion.  Cardiovascular:     Rate and Rhythm: Normal rate and regular rhythm.     Heart sounds: Normal heart sounds. No murmur. No friction rub. No gallop.   Pulmonary:     Effort: Pulmonary effort is normal. No tachypnea or respiratory distress.     Breath sounds: Normal breath sounds. No decreased breath sounds, wheezing, rhonchi or rales.  Chest:     Chest wall: No tenderness.  Abdominal:     General: Bowel sounds are normal.     Palpations: Abdomen is soft.  Musculoskeletal: Normal range of motion.  Skin:    General: Skin is warm and dry.  Neurological:     Mental Status: He is alert and oriented to person, place, and time.     Coordination: Coordination normal.  Psychiatric:        Behavior: Behavior normal. Behavior is cooperative.        Thought Content: Thought content normal.        Judgment: Judgment normal.          Patient has been counseled extensively about nutrition and exercise as well as the importance of adherence with medications and regular follow-up. The patient was given clear instructions to go to ER or return to  medical center if symptoms don't improve, worsen or new problems develop. The patient verbalized understanding.   Follow-up: No follow-ups on file.   Gildardo Pounds, FNP-BC [facility] and [place], [place]   11/12/2018, 7:41 PM

## 2018-11-12 NOTE — Telephone Encounter (Signed)
CMA attempt to reach patient to let patient know that [facility] pharmacy does not carry the Betamethasone Valerate 0.12% foam and PCP would like to know what pharmacy patient would like PCP send it to.   CMA did leave a message and a call back number.

## 2018-11-13 LAB — CMP14+EGFR
ALBUMIN: 4.8 g/dL (ref 4.0–5.0)
ALT: 56 IU/L — ABNORMAL HIGH (ref 0–44)
AST: 28 IU/L (ref 0–40)
Albumin/Globulin Ratio: 2.5 — ABNORMAL HIGH (ref 1.2–2.2)
Alkaline Phosphatase: 85 IU/L (ref 39–117)
BUN/Creatinine Ratio: 19 (ref 9–20)
BUN: 16 mg/dL (ref 6–20)
Bilirubin Total: 0.4 mg/dL (ref 0.0–1.2)
CO2: 22 mmol/L (ref 20–29)
CREATININE: 0.84 mg/dL (ref 0.76–1.27)
Calcium: 8.9 mg/dL (ref 8.7–10.2)
Chloride: 103 mmol/L (ref 96–106)
GFR calc Af Amer: 131 mL/min/{1.73_m2} (ref >59)
GFR calc non Af Amer: 113 mL/min/{1.73_m2} (ref >59)
GLOBULIN, TOTAL: 1.9 g/dL (ref 1.5–4.5)
Glucose: 90 mg/dL (ref 65–99)
Potassium: 4.1 mmol/L (ref 3.5–5.2)
SODIUM: 142 mmol/L (ref 134–144)
Total Protein: 6.7 g/dL (ref 6.0–8.5)

## 2018-11-13 LAB — CBC
HEMATOCRIT: 43.7 % (ref 37.5–51.0)
Hemoglobin: 14.9 g/dL (ref 13.0–17.7)
MCH: 27.2 pg (ref 26.6–33.0)
MCHC: 34.1 g/dL (ref 31.5–35.7)
MCV: 80 fL (ref 79–97)
PLATELETS: 250 10*3/uL (ref 150–450)
RBC: 5.48 x10E6/uL (ref 4.14–5.80)
RDW: 13.7 % (ref 11.6–15.4)
WBC: 8.7 10*3/uL (ref 3.4–10.8)

## 2018-11-13 LAB — LIPID PANEL
CHOLESTEROL TOTAL: 199 mg/dL (ref 100–199)
Chol/HDL Ratio: 4.1 ratio (ref 0.0–5.0)
HDL: 48 mg/dL (ref >39)
LDL Calculated: 115 mg/dL — ABNORMAL HIGH (ref 0–99)
Triglycerides: 180 mg/dL — ABNORMAL HIGH (ref 0–149)
VLDL Cholesterol Cal: 36 mg/dL (ref 5–40)

## 2018-11-13 LAB — TSH: TSH: 1.17 u[IU]/mL (ref 0.450–4.500)

## 2018-11-16 NOTE — Telephone Encounter (Signed)
-----   Message from Gildardo Pounds, NP sent at 11/16/2018  6:55 AM EDT ----- Thyroid level is normal as well as kidney and liver function. There is no anemia or iron deficiency. Cholesterol levels are elevated. INSTRUCTIONS: Work on a low fat, heart healthy diet and participate in regular aerobic exercise program by working out at least 150 minutes per week; 5 days a week-30 minutes per day. Avoid red meat, fried foods. junk foods, sodas, sugary drinks, unhealthy snacking, alcohol and smoking.  Drink at least 48oz of water per day and monitor your carbohydrate intake daily.

## 2018-11-16 NOTE — Telephone Encounter (Signed)
CMA attempt to reach patient to inform on lab results.  No answer and left a VM for a call back.  

## 2018-12-24 ENCOUNTER — Encounter: Payer: Self-pay | Admitting: Nurse Practitioner

## 2018-12-24 ENCOUNTER — Ambulatory Visit: Payer: Self-pay | Attending: Nurse Practitioner | Admitting: Nurse Practitioner

## 2018-12-24 ENCOUNTER — Other Ambulatory Visit: Payer: Self-pay

## 2018-12-24 DIAGNOSIS — F419 Anxiety disorder, unspecified: Secondary | ICD-10-CM

## 2018-12-24 DIAGNOSIS — L639 Alopecia areata, unspecified: Secondary | ICD-10-CM

## 2018-12-24 MED ORDER — BUSPIRONE HCL 10 MG PO TABS: 10.0000 mg | ORAL_TABLET | Freq: Two times a day (BID) | ORAL | 0 refills | Status: AC

## 2018-12-24 NOTE — Progress Notes (Signed)
Virtual Visit via Telephone Note  I connected with Krista Crafton on 12/24/18  at   3:05 PM EDT  EDT by telephone and verified that I am speaking with the correct person using two identifiers.   Consent I discussed the limitations, risks, security and privacy concerns of performing an evaluation and management service by telephone and the availability of in person appointments. I also discussed with the patient that there may be a patient responsible charge related to this service. The patient expressed understanding and agreed to proceed.   Location of Patient:  Private residence   Location of Provider: [place] and [place] participating in Telemedicine visit: Geryl Rankins FNP-BC [place] ID# [id number]   History of Present Illness: This is a follow up telemedicine call for alopecia areata. He was prescribed Buspar and Betamethasone Valerate at his last office visit with me on 11-12-2018. Unfortunately he was not able to pick the steroid foam up from the pharmacy however he was able to pick up the Buspar which he states has significantly decreased his anxiety symptoms and also his hair has started growing back in the affected area which was evaluated at his last office visit.  It appears his alopecia was likely related to stress.    Observations/Objective: Awake, alert and oriented x3.    Assessment and Plan: Diagnoses and all orders for this visit:  Anxiety Stable at this time. -     busPIRone (BUSPAR) 10 MG tablet; Take 1 tablet (10 mg total) by mouth 2 (two) times daily.  Alopecia areata Resolving gradually.  Follow Up Instructions Return in about 2 months (around 02/23/2019) for Anxiety.     I discussed the assessment and treatment plan with the patient. The patient was provided an opportunity to ask questions and all were answered. The patient agreed with the plan and demonstrated an understanding of  the instructions.   The patient was advised to call back or seek an in-person evaluation if the symptoms worsen or if the condition fails to improve as anticipated.  I provided 12 minutes of non-face-to-face time during this encounter including median intraservice time, reviewing previous notes, labs, imaging, medications and explaining diagnosis and management.  Gildardo Pounds, FNP-BC

## 2019-02-12 MED FILL — ?CETIRIZINE HCL 10 MG TABLE: 10 | 30 days supply | Qty: 30 | Fill #0

## 2019-02-12 MED FILL — $VENTOLIN HFA 18G INHALER: 108 (90 BAS | 25 days supply | Qty: 18 | Fill #0

## 2019-02-12 MED FILL — ?busPIRONE HCL 10MG TABLETS: 10 | 30 days supply | Qty: 60 | Fill #0

## 2019-02-12 MED FILL — MONTELUKAST SOD 10 MG TAB: 10 | 30 days supply | Qty: 30 | Fill #0

## 2019-02-19 ENCOUNTER — Other Ambulatory Visit: Payer: Self-pay | Admitting: Pharmacist

## 2019-02-19 DIAGNOSIS — J4551 Severe persistent asthma with (acute) exacerbation: Secondary | ICD-10-CM

## 2019-02-19 MED ORDER — FLUTICASONE PROPIONATE 50 MCG/ACT NA SUSP: 2.0000 | Freq: Every day | NASAL | 2 refills | Status: DC

## 2019-03-18 MED FILL — $VENTOLIN HFA 18G INHALER: 108 (90 BAS | 25 days supply | Qty: 18 | Fill #1

## 2019-03-18 MED FILL — FLUTICASONE PROP 50 MCG SPR: 50 | 30 days supply | Qty: 16 | Fill #0

## 2019-03-18 MED FILL — MONTELUKAST SOD 10 MG TAB: 10 | 30 days supply | Qty: 30 | Fill #1

## 2019-05-24 MED FILL — ?CETIRIZINE HCL 10 MG TABLE: 10 | 30 days supply | Qty: 30 | Fill #1

## 2019-05-24 MED FILL — !ADVAIR 250/50 DISKUS: 250-50 | 30 days supply | Qty: 60 | Fill #1

## 2019-05-24 MED FILL — $VENTOLIN HFA 18G INHALER: 108 (90 BAS | 25 days supply | Qty: 18 | Fill #2

## 2019-05-24 MED FILL — MONTELUKAST SOD 10 MG TAB: 10 | 30 days supply | Qty: 30 | Fill #2

## 2019-05-24 MED FILL — ALBUTEROL SUL 2.5 MG/3 ML S: (2.5 MG/3ML | 2 days supply | Qty: 75 | Fill #1

## 2019-05-24 MED FILL — FLUTICASONE PROP 50 MCG SPR: 50 | 30 days supply | Qty: 16 | Fill #1

## 2019-06-05 ENCOUNTER — Ambulatory Visit: Payer: Self-pay

## 2019-06-12 ENCOUNTER — Ambulatory Visit: Payer: Self-pay | Attending: Nurse Practitioner

## 2019-06-12 ENCOUNTER — Other Ambulatory Visit: Payer: Self-pay

## 2019-07-08 ENCOUNTER — Ambulatory Visit: Payer: Self-pay | Admitting: Nurse Practitioner

## 2019-07-18 ENCOUNTER — Other Ambulatory Visit: Payer: Self-pay | Admitting: Nurse Practitioner

## 2019-07-18 DIAGNOSIS — J4530 Mild persistent asthma, uncomplicated: Secondary | ICD-10-CM

## 2019-07-18 MED FILL — !VENTOLIN HFA INHALER: 108 (90 BAS | 25 days supply | Qty: 18 | Fill #0

## 2019-07-18 MED FILL — MONTELUKAST SOD 10 MG TAB: 10 | 30 days supply | Qty: 30 | Fill #3

## 2019-07-22 ENCOUNTER — Other Ambulatory Visit: Payer: Self-pay

## 2019-07-22 ENCOUNTER — Ambulatory Visit: Payer: Self-pay | Attending: Nurse Practitioner | Admitting: Nurse Practitioner

## 2019-07-22 ENCOUNTER — Encounter: Payer: Self-pay | Admitting: Nurse Practitioner

## 2019-07-22 DIAGNOSIS — F419 Anxiety disorder, unspecified: Secondary | ICD-10-CM

## 2019-07-22 DIAGNOSIS — J4530 Mild persistent asthma, uncomplicated: Secondary | ICD-10-CM

## 2019-07-22 MED ORDER — BUSPIRONE HCL 10 MG PO TABS: 10.0000 mg | ORAL_TABLET | Freq: Three times a day (TID) | ORAL | 1 refills | Status: AC

## 2019-07-22 MED ORDER — PREDNISONE 20 MG PO TABS: 20.0000 mg | ORAL_TABLET | Freq: Every day | ORAL | 0 refills | Status: AC

## 2019-07-22 MED ORDER — ALBUTEROL SULFATE (2.5 MG/3ML) 0.083% IN NEBU: 5.0000 mg | INHALATION_SOLUTION | RESPIRATORY_TRACT | 11 refills | Status: DC | PRN

## 2019-07-22 MED ORDER — ALBUTEROL SULFATE HFA 108 (90 BASE) MCG/ACT IN AERS: INHALATION_SPRAY | RESPIRATORY_TRACT | 0 refills | Status: DC

## 2019-07-22 MED ORDER — ADVAIR DISKUS 250-50 MCG/DOSE IN AEPB: 1.0000 | INHALATION_SPRAY | Freq: Two times a day (BID) | RESPIRATORY_TRACT | 3 refills | Status: DC

## 2019-07-22 NOTE — Progress Notes (Signed)
Virtual Visit via Telephone Note Due to national recommendations of social distancing due to [place] 19, telehealth visit is felt to be most appropriate for this patient at this time.  I discussed the limitations, risks, security and privacy concerns of performing an evaluation and management service by telephone and the availability of in person appointments. I also discussed with the patient that there may be a patient responsible charge related to this service. The patient expressed understanding and agreed to proceed.    I connected with Nicholas Sanford on 07/22/19  at   2:30 PM EST  EDT by telephone and verified that I am speaking with the correct person using two identifiers.   Consent I discussed the limitations, risks, security and privacy concerns of performing an evaluation and management service by telephone and the availability of in person appointments. I also discussed with the patient that there may be a patient responsible charge related to this service. The patient expressed understanding and agreed to proceed.   Location of Patient: Private Residence   Location of Provider: [place] and [place] participating in Telemedicine visit: Geryl Rankins FNP-BC [place]  Interpreter  ID# Allena Katz [id number]   History of Present Illness: Telemedicine visit for: Anxiety  Anxiety He was prescribed Buspar in April (10 mg BID).  Feels it his helping his anxiety only a little.  He is overeating due to stress. States he will eat and then go looking for extra food. He is home more with his children due to their virtual learning. Not used to being home most of the day. I have instructed him that he needs to go for a walk when he feels bored or stressed. I also encouraged him to start eating energy foods. He is not exercising.  Asthma Has not picked up his advair in several months.  Endorses increased shortness of breath and wheezing with  weather changes. Will prescribe short dose of prednisone. He denies any fever, productive cough or other URI symptoms.     Past Medical History:  Diagnosis Date  . Allergy    sesonal   . Asthma    as a child     Past Surgical History:  Procedure Laterality Date  . APPENDECTOMY  2014  . LAPAROSCOPIC APPENDECTOMY N/A 08/11/2013   Procedure: APPENDECTOMY LAPAROSCOPIC;  Surgeon: Shann Medal, MD;  Location: [facility];  Service: General;  Laterality: N/A;    Family History  Problem Relation Age of Onset  . Asthma Mother     Social History   Socioeconomic History  . Marital status: Single    Spouse name: n/a  . Number of children: 2  . Years of education: Not on file  . Highest education level: Not on file  Occupational History  . 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 Financial resource strain: Not on file  . Food insecurity    Worry: Not on file    Inability: Not on file  . Transportation needs    Medical: Not on file    Non-medical: Not on file  Tobacco Use  . Smoking status: Never Smoker  . Smokeless tobacco: Never Used  Substance and Sexual Activity  . Alcohol use: No    Alcohol/week: 0.0 standard drinks  . Drug use: No  . Sexual activity: Yes  Lifestyle  . Physical activity    Days per week: Not on file  Minutes per session: Not on file  . Stress: Not on file  Relationships  . Social Herbalist on phone: Not on file    Gets together: Not on file    Attends religious service: Not on file    Active member of club or organization: Not on file    Attends meetings of clubs or organizations: Not on file    Relationship status: Not on file  Other Topics Concern  . Not on file  Social History Narrative   From [place].  Came to the [place] in 2002.  Lives with his girlfriend and their child, and his child from a previous relationship.     Observations/Objective: Awake, alert and oriented x  3   Review of Systems  Constitutional: Negative for fever, malaise/fatigue and weight loss.  HENT: Negative.  Negative for nosebleeds.   Eyes: Negative.  Negative for blurred vision, double vision and photophobia.  Respiratory: Positive for cough and shortness of breath.   Cardiovascular: Negative.  Negative for chest pain, palpitations and leg swelling.  Gastrointestinal: Negative.  Negative for heartburn, nausea and vomiting.  Musculoskeletal: Negative.  Negative for myalgias.  Neurological: Negative.  Negative for dizziness, focal weakness, seizures and headaches.  Psychiatric/Behavioral: Negative for suicidal ideas. The patient is nervous/anxious.     Assessment and Plan: Amil was seen today for increase of appetite.  Diagnoses and all orders for this visit:  Anxiety -     busPIRone (BUSPAR) 10 MG tablet; Take 1 tablet (10 mg total) by mouth 3 (three) times daily.  Mild persistent asthma without complication -     ADVAIR DISKUS 250-50 MCG/DOSE AEPB; Inhale 1 puff into the lungs 2 (two) times daily. -     albuterol (VENTOLIN HFA) 108 (90 Base) MCG/ACT inhaler; INHALE 2 PUFFS INTO THE LUNGS EVERY 6 HOURS AS NEEDED FOR WHEEZING OR SHORTNESS OF BREATH -     albuterol (PROVENTIL) (2.5 MG/3ML) 0.083% nebulizer solution; Take 6 mLs (5 mg total) by nebulization every 4 (four) hours as needed for wheezing or shortness of breath. -     predniSONE (DELTASONE) 20 MG tablet; Take 1 tablet (20 mg total) by mouth daily with breakfast for 5 days.     Follow Up Instructions Return in about 3 months (around 10/22/2019).     I discussed the assessment and treatment plan with the patient. The patient was provided an opportunity to ask questions and all were answered. The patient agreed with the plan and demonstrated an understanding of the instructions.   The patient was advised to call back or seek an in-person evaluation if the symptoms worsen or if the condition fails to improve as  anticipated.  I provided 22 minutes of non-face-to-face time during this encounter including median intraservice time, reviewing previous notes, labs, imaging, medications and explaining diagnosis and management.  Gildardo Pounds, FNP-BC

## 2019-07-23 MED FILL — ALBUTEROL SUL 2.5 MG/3 ML S: (2.5 MG/3ML | 2 days supply | Qty: 75 | Fill #0

## 2019-07-23 MED FILL — busPIRone HCL 10 MG TABS: 10 | 30 days supply | Qty: 90 | Fill #0

## 2019-07-23 MED FILL — predniSONE 20 MG TABS: 20 | 5 days supply | Qty: 5 | Fill #0

## 2019-07-23 MED FILL — $ADVAIR 250/50 MCG INHALER: 250-50 | 90 days supply | Qty: 180 | Fill #0

## 2019-09-09 MED FILL — MONTELUKAST SOD 10 MG TAB: 10 | 30 days supply | Qty: 30 | Fill #4

## 2019-09-09 MED FILL — $VENTOLIN HFA 18G INHALER: 108 (90 BAS | 25 days supply | Qty: 18 | Fill #0

## 2019-10-28 ENCOUNTER — Other Ambulatory Visit: Payer: Self-pay | Admitting: Nurse Practitioner

## 2019-10-28 DIAGNOSIS — J4530 Mild persistent asthma, uncomplicated: Secondary | ICD-10-CM

## 2019-10-28 MED FILL — ?CETIRIZINE HCL 10 MG TABLE: 10 | 30 days supply | Qty: 30 | Fill #2

## 2019-10-28 MED FILL — MONTELUKAST SOD 10 MG TAB: 10 | 30 days supply | Qty: 30 | Fill #5

## 2019-10-28 NOTE — Telephone Encounter (Signed)
Patient needs to be scheduled an OV prior to receiving refills.

## 2019-10-28 NOTE — Telephone Encounter (Signed)
(  VENTOLIN HFA) 108 (90 Base) MCG/ACT inhaler [id number]  Patient called requesting all medications and is currently out of Ventolin.

## 2019-11-04 ENCOUNTER — Other Ambulatory Visit: Payer: Self-pay

## 2019-11-04 ENCOUNTER — Ambulatory Visit: Payer: Self-pay | Attending: Family | Admitting: Family

## 2019-11-04 ENCOUNTER — Encounter: Payer: Self-pay | Admitting: Family

## 2019-11-04 VITALS — BP 122/71 | HR 98 | Ht 69.0 in | Wt 280.5 lb

## 2019-11-04 DIAGNOSIS — J4530 Mild persistent asthma, uncomplicated: Secondary | ICD-10-CM

## 2019-11-04 DIAGNOSIS — R140 Abdominal distension (gaseous): Secondary | ICD-10-CM

## 2019-11-04 MED ORDER — ALBUTEROL SULFATE HFA 108 (90 BASE) MCG/ACT IN AERS: INHALATION_SPRAY | RESPIRATORY_TRACT | 0 refills | Status: DC

## 2019-11-04 MED ORDER — ADVAIR DISKUS 250-50 MCG/DOSE IN AEPB: 1.0000 | INHALATION_SPRAY | Freq: Two times a day (BID) | RESPIRATORY_TRACT | 2 refills | Status: DC

## 2019-11-04 MED ORDER — FLUTICASONE PROPIONATE 50 MCG/ACT NA SUSP: 2.0000 | Freq: Every day | NASAL | 2 refills | Status: DC

## 2019-11-04 MED FILL — $ADVAIR 250/50 MCG INHALER: 250-50 | 90 days supply | Qty: 180 | Fill #0

## 2019-11-04 MED FILL — $VENTOLIN HFA 18G INHALER: 108 (90 BAS | 25 days supply | Qty: 18 | Fill #0

## 2019-11-04 MED FILL — FLUTICASONE PROP 50 MCG SPR: 50 | 30 days supply | Qty: 16 | Fill #0

## 2019-11-04 NOTE — Progress Notes (Signed)
Patient ID: Nicholas Sanford, male    DOB: Nov 03, 1985  MRN: [id number]  CC: Asthma follow-up  Subjective: Nicholas Sanford is a 34 y.o. male with history of asthma, severe persistent well-controlled, poor dentition, allergic, and severe obesity without comorbidity who presents for asthma follow-up. [address], [place] VV#[id number] is the Spanish interpreter for today's visit.  1. ASTHMA FOLLOW-UP:  Currently taking: see med list Taking albuterol MDI? No. Has not taken for 6 days because he ran out of medication. Prior to running out of medication was taking as prescribed. Taking nebulized albuterol?  Yes. Has been taking every 6 hours daily for the last 6 days because he ran out of the albuterol inhaler. Taking controller medication? No. Has not taken Advair for 6 days because he ran out of medication. Prior to running out of medication was taking as prescribed. Frequency of rescue medication use: 2 times daily only when feeling exerted with exercise Med Adherence: Not for the last 6 days Recent wheezing? Denies Shortness of breath? Sometimes but related to mask Cough? Denies Recent exacerbations/ER visits/hospitalizations: Denies Asthma Triggers: grapes, green apples  Activity limitations? Exercising  Asthma stage: Severe persistent, well-controlled Comments: Denies chest pain and heart palpitations. Had 1 albuterol neb treatment this morning.  Last visit November 2020 with nurse practitioner Raul Del during that encounter Advair Diskus, albuterol inhaler, albuterol nebulizer, and prednisone were refilled.  2. BLOATING:  Says he feels bloated lately especially after eating spaghetti and soups. Has regular bowel movements at least once per day. Denies nausea, vomiting, and abdominal pain.   Patient Active Problem List   Diagnosis Date Noted  . Severe obesity (BMI 35.0-39.9) with comorbidity ([place]) 11/12/2018  . Obesity (BMI 30-39.9) 06/03/2015  . Poor dentition 05/26/2015  .  Allergic 12/16/2011  . Asthma, severe persistent, well-controlled 10/09/2011     Current Outpatient Medications on File Prior to Visit  Medication Sig Dispense Refill  . ADVAIR DISKUS 250-50 MCG/DOSE AEPB Inhale 1 puff into the lungs 2 (two) times daily. 180 each 3  . albuterol (PROVENTIL) (2.5 MG/3ML) 0.083% nebulizer solution Take 6 mLs (5 mg total) by nebulization every 4 (four) hours as needed for wheezing or shortness of breath. 75 mL 11  . albuterol (VENTOLIN HFA) 108 (90 Base) MCG/ACT inhaler INHALE 2 PUFFS INTO THE LUNGS EVERY 6 HOURS AS NEEDED FOR WHEEZING OR SHORTNESS OF BREATH 18 g 0  . cetirizine (ZYRTEC) 10 MG tablet Take 1 tablet (10 mg total) by mouth daily. 30 tablet 11  . fluticasone (FLONASE) 50 MCG/ACT nasal spray Place 2 sprays into both nostrils daily. 16 g 2  . montelukast (SINGULAIR) 10 MG tablet Take 1 tablet (10 mg total) by mouth at bedtime. 30 tablet 11   No current facility-administered medications on file prior to visit.    Allergies  Allergen Reactions  . Penicillins Itching    Social History   Socioeconomic History  . Marital status: Single    Spouse name: n/a  . Number of children: 2  . Years of education: Not on file  . Highest education level: Not on file  Occupational History  . 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 Smoking status: Never Smoker  . Smokeless tobacco: Never Used  Substance and Sexual Activity  . Alcohol use: No    Alcohol/week: 0.0 standard drinks  . Drug use: No  . Sexual activity: Yes  Other Topics Concern  . Not on  file  Social History Narrative   From [place].  Came to the [place] in 2002.  Lives with his girlfriend and their child, and his child from a previous relationship.   Social Determinants of Health   Financial Resource Strain:   . Difficulty of Paying Living Expenses: Not on file  Food Insecurity:   . Worried About Charity fundraiser in the Last  Year: Not on file  . Ran Out of Food in the Last Year: Not on file  Transportation Needs:   . Lack of Transportation (Medical): Not on file  . Lack of Transportation (Non-Medical): Not on file  Physical Activity:   . Days of Exercise per Week: Not on file  . Minutes of Exercise per Session: Not on file  Stress:   . Feeling of Stress : Not on file  Social Connections:   . Frequency of Communication with Friends and Family: Not on file  . Frequency of Social Gatherings with Friends and Family: Not on file  . Attends Religious Services: Not on file  . Active Member of Clubs or Organizations: Not on file  . Attends Archivist Meetings: Not on file  . Marital Status: Not on file  Intimate Partner Violence:   . Fear of Current or Ex-Partner: Not on file  . Emotionally Abused: Not on file  . Physically Abused: Not on file  . Sexually Abused: Not on file    Family History  Problem Relation Age of Onset  . Asthma Mother     Past Surgical History:  Procedure Laterality Date  . APPENDECTOMY  2014  . LAPAROSCOPIC APPENDECTOMY N/A 08/11/2013   Procedure: APPENDECTOMY LAPAROSCOPIC;  Surgeon: Shann Medal, MD;  Location: [facility];  Service: General;  Laterality: N/A;    ROS: Negative except as stated above  PHYSICAL EXAM: Vitals with BMI 11/04/2019 11/12/2018 08/06/2018  Height '5\' 9"'$  '5\' 9"'$  '5\' 4"'$   Weight 280 lbs 8 oz 264 lbs 256 lbs  BMI 41.4 97.58 83.25  Systolic 498 264 158  Diastolic 71 78 91  Pulse 98 79 104   Physical Exam General appearance - alert, well appearing, and in no distress and oriented to person, place, and time Mental status - alert, oriented to person, place, and time, normal mood, behavior, speech, dress, motor activity, and thought processes Chest - clear to auscultation, no wheezes, rales or rhonchi, symmetric air entry, no tachypnea, retractions or cyanosis Heart - normal rate, regular rhythm, normal S1, S2, no murmurs, rubs, clicks or gallops   CMP  Latest Ref Rng & Units 11/12/2018 03/07/2018 08/11/2013  Glucose 65 - 99 mg/dL 90 85 136(H)  BUN 6 - 20 mg/dL '16 15 13  '$ Creatinine 0.76 - 1.27 mg/dL 0.84 0.72(L) 0.77  Sodium 134 - 144 mmol/L 142 141 131(L)  Potassium 3.5 - 5.2 mmol/L 4.1 4.2 3.2(L)  Chloride 96 - 106 mmol/L 103 103 93(L)  CO2 20 - 29 mmol/L '22 23 24  '$ Calcium 8.7 - 10.2 mg/dL 8.9 9.6 9.3  Total Protein 6.0 - 8.5 g/dL 6.7 - -  Total Bilirubin 0.0 - 1.2 mg/dL 0.4 - -  Alkaline Phos 39 - 117 IU/L 85 - -  AST 0 - 40 IU/L 28 - -  ALT 0 - 44 IU/L 56(H) - -   Lipid Panel     Component Value Date/Time   CHOL 199 11/12/2018 1525   TRIG 180 (H) 11/12/2018 1525   HDL 48 11/12/2018 1525   CHOLHDL 4.1  11/12/2018 1525   LDLCALC 115 (H) 11/12/2018 1525    CBC    Component Value Date/Time   WBC 8.7 11/12/2018 1525   WBC 28.3 (A) 08/11/2013 0850   RBC 5.48 11/12/2018 1525   RBC 5.61 08/11/2013 0850   HGB 14.9 11/12/2018 1525   HCT 43.7 11/12/2018 1525   PLT 250 11/12/2018 1525   MCV 80 11/12/2018 1525   MCH 27.2 11/12/2018 1525   MCH 28.5 08/11/2013 0850   MCHC 34.1 11/12/2018 1525   MCHC 32.5 08/11/2013 0850   RDW 13.7 11/12/2018 1525   Results for orders placed or performed in visit on 11/12/18  Lipid panel  Result Value Ref Range   Cholesterol, Total 199 100 - 199 mg/dL   Triglycerides 180 (H) 0 - 149 mg/dL   HDL 48 >39 mg/dL   VLDL Cholesterol Cal 36 5 - 40 mg/dL   LDL Calculated 115 (H) 0 - 99 mg/dL   Chol/HDL Ratio 4.1 0.0 - 5.0 ratio  CMP14+EGFR  Result Value Ref Range   Glucose 90 65 - 99 mg/dL   BUN 16 6 - 20 mg/dL   Creatinine, Ser 0.84 0.76 - 1.27 mg/dL   GFR calc non Af Amer 113 >59 mL/min/1.73   GFR calc Af Amer 131 >59 mL/min/1.73   BUN/Creatinine Ratio 19 9 - 20   Sodium 142 134 - 144 mmol/L   Potassium 4.1 3.5 - 5.2 mmol/L   Chloride 103 96 - 106 mmol/L   CO2 22 20 - 29 mmol/L   Calcium 8.9 8.7 - 10.2 mg/dL   Total Protein 6.7 6.0 - 8.5 g/dL   Albumin 4.8 4.0 - 5.0 g/dL   Globulin, Total  1.9 1.5 - 4.5 g/dL   Albumin/Globulin Ratio 2.5 (H) 1.2 - 2.2   Bilirubin Total 0.4 0.0 - 1.2 mg/dL   Alkaline Phosphatase 85 39 - 117 IU/L   AST 28 0 - 40 IU/L   ALT 56 (H) 0 - 44 IU/L  CBC  Result Value Ref Range   WBC 8.7 3.4 - 10.8 x10E3/uL   RBC 5.48 4.14 - 5.80 x10E6/uL   Hemoglobin 14.9 13.0 - 17.7 g/dL   Hematocrit 43.7 37.5 - 51.0 %   MCV 80 79 - 97 fL   MCH 27.2 26.6 - 33.0 pg   MCHC 34.1 31.5 - 35.7 g/dL   RDW 13.7 11.6 - 15.4 %   Platelets 250 150 - 450 x10E3/uL  TSH  Result Value Ref Range   TSH 1.170 0.450 - 4.500 uIU/mL   ASSESSMENT AND PLAN: 1. Mild persistent asthma without complication: - ADVAIR DISKUS 250-50 MCG/DOSE AEPB; Inhale 1 puff into the lungs 2 (two) times daily.  Dispense: 180 each; Refill: 2 - albuterol (VENTOLIN HFA) 108 (90 Base) MCG/ACT inhaler; INHALE 2 PUFFS INTO THE LUNGS EVERY 6 HOURS AS NEEDED FOR WHEEZING OR SHORTNESS OF BREATH  Dispense: 18 g; Refill: 0 -albuterol (VENTOLIN HFA) 108 (90 Base) MCG/ACT inhaler; INHALE 2 PUFFS INTO THE LUNGS EVERY 6 HOURS AS NEEDED FOR WHEEZING OR SHORTNESS OF BREATH. Refilled July 22, 2019 with 11 refills, no additional refills required during today's visit    -Follow-up with attending physician in about 3 months for management  2. Bloating: -Avoid gas producing foods such as cabbage, beans, onions, broccoli, brussels sprouts, wheat, and potatoes.   Patient was given the opportunity to ask questions.  Patient verbalized understanding of the plan and was able to repeat key elements of the plan. Patient was given  clear instructions to go to Emergency Department or return to medical center if symptoms don't improve, worsen, or new problems develop.The patient verbalized understanding.   Requested Prescriptions    No prescriptions requested or ordered in this encounter    Mireya Meditz Zachery Dauer, NP

## 2019-11-04 NOTE — Patient Instructions (Signed)
Medications refilled. Follow-up with attending physician in about 3 months or sooner if needed. Asma en los adultos Asthma, Adult  El asma es una afeccin a largo plazo (crnica) en la que las vas respiratorias se Investment banker, corporate y se obstruyen. Las vas respiratorias son los conductos que van desde la Lawyer y la boca hasta los pulmones. Una persona con asma pasar por momentos en que los sntomas empeoran. Estos se conocen como ataques de asma. Pueden causar tos, sonidos agudos al respirar (sibilancias), falta de aire y dolor en el pecho. Esto puede dificultar la respiracin. No hay una cura para el asma, pero los medicamentos y los cambios en el estilo de vida pueden ayudar a Therapist, sports. Hay muchas cosas que pueden provocar un ataque de asma o intensificar los sntomas de la enfermedad (factores desencadenantes). Los factores desencadenantes comunes incluyen los siguientes:  Moho.  Polvo.  Humo del cigarrillo.  Cucarachas.  Cosas que causan sntomas de alergia (alrgenos). Estas incluyen escamas de la piel de los animales (caspa) y el polen de los pastos o los rboles.  Cosas que contaminan el aire. Entre ellas, productos de limpieza del hogar, humo de lea, niebla contaminada u olores qumicos.  Aire fro, cambios climticos y el viento.  Llanto o risa intensos.  Estrs.  Ciertos medicamentos o frmacos.  Ciertos alimentos, como frutas secas, papas fritas y [place].  Infecciones tales como los resfros o la gripe.  Ciertas enfermedades o afecciones.  Ejercicio o actividades extenuantes. El asma se puede tratar con medicamentos y mantenindose alejado de los factores que desencadenan los ataques de asma. Los medicamentos pueden incluir los siguientes:  Medicamentos de control. Estos ayudan a evitar los sntomas de asma. Generalmente, se toman [employer].  Medicamentos de [place] o de rescate de accin rpida. Estos alivian los sntomas de asma rpidamente. Se utilizan  cuando es necesario y proporcionan alivio a Control and instrumentation engineer.  Medicamentos para alergias si los ataques son ocasionados por alrgenos.  Medicamentos para ayudar a Event organiser de defensa (inmunitario) del organismo. Siga estas instrucciones en su casa: Cmo evitar desencadenantes en su hogar  Cambie el filtro de la calefaccin y del aire acondicionado con frecuencia.  Limite el uso de hogares o estufas a lea.  Elimine las plagas (como cucarachas, ratones) y sus excrementos.  Deseche las plantas si observa moho en ellas.  Limpie los pisos. Elimine el polvo regularmente. Use productos de limpieza que sean inodoros.  Pdale a alguien que pase la aspiradora cuando usted no se encuentre en casa. Utilice una aspiradora con un filtro de aire de alta eficiencia (HEPA), siempre que sea posible.  Reemplace las alfombras por pisos de [place], baldosas o vinilo. Las alfombras pueden retener las escamas de la piel de los animales y [place].  Use almohadas, mantas y Government social research officer.  [place] sbanas y las mantas todas las semanas con agua caliente. Squelas en Howell Rucks.  Mantenga su habitacin libre de desencadenantes.  Evite las [employer] y [employer] ventanas cerradas cuando en el aire haya cosas que causen sntomas de [place].  Use mantas de polister o algodn.  Limpie baos y cocinas con lavandina. Si fuera posible, pdale a alguien que vuelva a pintar las paredes de estas habitaciones con Ardelia Mems pintura resistente a los hongos. Aljese de las habitaciones que se estn limpiando y pintando.  Lvese las manos frecuentemente con agua y [place]. Use desinfectante para manos si no dispone de [place] y [place].  No permita que nadie fume  en su casa. Instrucciones generales  Use los medicamentos de venta libre y los recetados solamente como se lo haya indicado el mdico. ? Hable con el mdico si tiene preguntas sobre cmo y cundo tomar sus medicamentos. ? Tome nota si  necesita tomar sus medicamentos con ms frecuencia que lo usual.  No consuma ningn producto que contenga nicotina o tabaco, como cigarrillos y cigarrillos electrnicos. Si necesita ayuda para dejar de consumir, consulte al mdico.  Aljese del humo de otros fumadores.  Evite realizar actividades al aire libre cuando la cantidad de alrgenos sea alta y la calidad del aire sea baja.  Use un pasamontaas al hacer actividades al Alexis Goodell en invierno. Este debe cubrir la nariz y la boca. [id number] fros, ejerctese en interiores, de ser posible.  Entre en calor antes de hacer ejercicio. Dedique tiempo a enfriarse luego de Data processing manager fsicas.  Use un espirmetro como se lo haya indicado el mdico. El espirmetro es una herramienta que mide el funcionamiento de los pulmones.  Lleve un registro de los valores del espirmetro. Antelos.  Siga el plan de accin para el asma. Este es una planificacin por escrito para el control del asma y el tratamiento de los ataques.  Asegrese de recibir todas las vacunas que el mdico le recomiende. Consulte al [employer] vacunas para la gripe y la neumona.  Concurra a todas las visitas de seguimiento como se lo haya indicado el mdico. Esto es importante. Comunquese con un mdico si:  Tiene sibilancias, respira con dificultad o tose aun cuando toma medicamentos para prevenir ataques.  La mucosidad que expectora cuando tose (esputo) es ms espesa que lo habitual.  La mucosidad que expectora cambia de trasparente o blanco a amarillo, verde, gris o sanguinolenta.  Tiene trastornos a causa de los [employer] toma, por ejemplo: ? Erupcin cutnea. ? Picazn. ? Hinchazn. ? Dificultad para respirar.  Necesita un medicamento de alivio ms de 2 a 3veces por semana.  El valor de flujo mximo an est entre el 77 y el 79% del Pharmacist, hospital personal despus de seguir el plan de accin durante 1hora.  Tiene fiebre. Solicite  ayuda inmediatamente si:  Futures trader y no responde al [employer] durante un ataque de asma.  Le falta el aire, incluso en reposo.  Le falta el aire incluso cuando hace muy poca actividad fsica.  Tiene dificultad para comer, beber o hablar.  Siente dolor u opresin en el pecho.  Tiene latidos cardacos acelerados.  Los labios o las uas se le tornan [place].  Siente que est por desvanecerse, est mareado o se desmaya.  Su flujo mximo es menor que el 50% del Pharmacist, hospital personal.  Se siente demasiado cansado para respirar con normalidad. Resumen  El asma es una afeccin a largo plazo (crnica) en la que las vas respiratorias se Investment banker, corporate y se obstruyen. El ataque de asma puede causar dificultad para respirar.  El asma no puede curarse, pero los [employer] y los cambios en el estilo de vida lo ayudarn a Aeronautical engineer enfermedad.  Asegrese de comprender cmo evitar los desencadenantes y cmo y [employer]. Esta informacin no tiene Marine scientist el consejo del mdico. Asegrese de hacerle al mdico cualquier pregunta que tenga. Document Revised: 12/03/2018 Document Reviewed: 03/29/2017 Elsevier Patient Education  [place].

## 2019-11-04 NOTE — Progress Notes (Signed)
Patient needs refills on inhaler and nebulizer medications.

## 2020-01-06 ENCOUNTER — Other Ambulatory Visit: Payer: Self-pay | Admitting: Family

## 2020-01-06 DIAGNOSIS — J4530 Mild persistent asthma, uncomplicated: Secondary | ICD-10-CM

## 2020-01-06 MED FILL — ALBUTEROL SUL 2.5 MG/3 ML S: (2.5 MG/3ML | 2 days supply | Qty: 75 | Fill #1

## 2020-01-07 MED FILL — $VENTOLIN HFA 18G INHALER: 108 (90 BAS | 25 days supply | Qty: 18 | Fill #0

## 2020-02-10 ENCOUNTER — Ambulatory Visit: Payer: Self-pay | Attending: Nurse Practitioner | Admitting: Nurse Practitioner

## 2020-02-10 ENCOUNTER — Encounter: Payer: Self-pay | Admitting: Nurse Practitioner

## 2020-02-10 ENCOUNTER — Other Ambulatory Visit: Payer: Self-pay

## 2020-02-10 DIAGNOSIS — J029 Acute pharyngitis, unspecified: Secondary | ICD-10-CM

## 2020-02-10 MED ORDER — AZITHROMYCIN 500 MG PO TABS: 500.0000 mg | ORAL_TABLET | Freq: Every day | ORAL | 0 refills | Status: AC

## 2020-02-10 MED ORDER — CEPACOL REGULAR STRENGTH 3 MG MT LOZG: 1.0000 | LOZENGE | OROMUCOSAL | 0 refills | Status: DC | PRN

## 2020-02-10 NOTE — Progress Notes (Signed)
Virtual Visit via Telephone Note Due to national recommendations of social distancing due to [place] 19, telehealth visit is felt to be most appropriate for this patient at this time.  I discussed the limitations, risks, security and privacy concerns of performing an evaluation and management service by telephone and the availability of in person appointments. I also discussed with the patient that there may be a patient responsible charge related to this service. The patient expressed understanding and agreed to proceed.    I connected with Nicholas Sanford on 02/10/20  at   2:30 PM EDT  EDT by telephone and verified that I am speaking with the correct person using two identifiers.   Consent I discussed the limitations, risks, security and privacy concerns of performing an evaluation and management service by telephone and the availability of in person appointments. I also discussed with the patient that there may be a patient responsible charge related to this service. The patient expressed understanding and agreed to proceed.   Location of Patient: Private  Residence    Location of Provider: [place] participating in Telemedicine visit: Geryl Rankins FNP-BC [place] Interpreter ID#    History of Present Illness: Telemedicine visit for: Sore throat  Endorses sore throat. Tonsils feel swollen and pain is worse at night. Associated symptoms: erythematous tonsils, cervical lymphadenopathy, dysphagia. Has tried vicks vapor rub on his neck and rinsing mouth with warm water. Symptoms started 1 week ago. Denies any fever. Denies any thick white coating in the back of the throat.   Requesting new mask and tubing for nebulizer machine  Past Medical History:  Diagnosis Date   Allergy    sesonal    Asthma    as a child     Past Surgical History:  Procedure Laterality Date   APPENDECTOMY  2014   LAPAROSCOPIC  APPENDECTOMY N/A 08/11/2013   Procedure: APPENDECTOMY LAPAROSCOPIC;  Surgeon: Shann Medal, MD;  Location: [facility];  Service: General;  Laterality: N/A;    Family History  Problem Relation Age of Onset   Asthma Mother     Social History   Socioeconomic History   Marital status: Single    Spouse name: n/a   Number of children: 2   Years of education: Not on file   Highest education level: Not on file  Occupational History   Occupation: unemployed    Employer: NOT EMPLIOYED    Comment: Public relations account executive  Tobacco Use   Smoking status: Never Smoker   Smokeless tobacco: Never Used  Substance and Sexual Activity   Alcohol use: No    Alcohol/week: 0.0 standard drinks   Drug use: No   Sexual activity: Yes  Other Topics Concern   Not on file  Social History Narrative   From [place].  Came to the [place] in 2002.  Lives with his girlfriend and their child, and his child from a previous relationship.   Social Determinants of Health   Financial Resource Strain:    Difficulty of Paying Living Expenses:   Food Insecurity:    Worried About Charity fundraiser in the Last Year:    Arboriculturist in the Last Year:   Transportation Needs:    Film/video editor (Medical):    Lack of Transportation (Non-Medical):   Physical Activity:    Days of Exercise per Week:    Minutes of Exercise per Session:   Stress:  Feeling of Stress :   Social Connections:    Frequency of Communication with Friends and Family:    Frequency of Social Gatherings with Friends and Family:    Attends Religious Services:    Active Member of Clubs or Organizations:    Attends Music therapist:    Marital Status:      Observations/Objective: Awake, alert and oriented x 3   Review of Systems  Constitutional: Negative for fever, malaise/fatigue and weight loss.  HENT: Positive for sore throat. Negative for ear discharge, ear pain, hearing loss, nosebleeds and  tinnitus.   Eyes: Negative.  Negative for blurred vision, double vision and photophobia.  Respiratory: Negative.  Negative for cough and shortness of breath.   Cardiovascular: Negative.  Negative for chest pain, palpitations and leg swelling.  Gastrointestinal: Negative.  Negative for heartburn, nausea and vomiting.  Musculoskeletal: Negative.  Negative for myalgias.  Neurological: Negative.  Negative for dizziness, focal weakness, seizures and headaches.  Psychiatric/Behavioral: Negative.  Negative for suicidal ideas.    Assessment and Plan: Nicholas Sanford was seen today for sore throat.  Diagnoses and all orders for this visit:  Pharyngitis, unspecified etiology -     azithromycin (ZITHROMAX) 500 MG tablet; Take 1 tablet (500 mg total) by mouth daily for 5 days. -     menthol-cetylpyridinium (CEPACOL REGULAR STRENGTH) 3 MG lozenge; Take 1 lozenge (3 mg total) by mouth as needed for sore throat.     Follow Up Instructions Return if symptoms worsen or fail to improve.     I discussed the assessment and treatment plan with the patient. The patient was provided an opportunity to ask questions and all were answered. The patient agreed with the plan and demonstrated an understanding of the instructions.   The patient was advised to call back or seek an in-person evaluation if the symptoms worsen or if the condition fails to improve as anticipated.  I provided 14 minutes of non-face-to-face time during this encounter including median intraservice time, reviewing previous notes, labs, imaging, medications and explaining diagnosis and management.  Gildardo Pounds, FNP-BC

## 2020-02-16 ENCOUNTER — Other Ambulatory Visit: Payer: Self-pay | Admitting: Nurse Practitioner

## 2020-02-16 DIAGNOSIS — E669 Obesity, unspecified: Secondary | ICD-10-CM

## 2020-02-16 DIAGNOSIS — Z130 Encounter for screening for diseases of the blood and blood-forming organs and certain disorders involving the immune mechanism: Secondary | ICD-10-CM

## 2020-02-17 ENCOUNTER — Ambulatory Visit: Payer: Self-pay | Attending: Nurse Practitioner

## 2020-02-17 ENCOUNTER — Other Ambulatory Visit: Payer: Self-pay | Admitting: Nurse Practitioner

## 2020-02-17 ENCOUNTER — Other Ambulatory Visit: Payer: Self-pay | Admitting: Family Medicine

## 2020-02-17 ENCOUNTER — Other Ambulatory Visit: Payer: Self-pay

## 2020-02-17 DIAGNOSIS — E669 Obesity, unspecified: Secondary | ICD-10-CM

## 2020-02-17 DIAGNOSIS — Z130 Encounter for screening for diseases of the blood and blood-forming organs and certain disorders involving the immune mechanism: Secondary | ICD-10-CM

## 2020-02-17 DIAGNOSIS — J4530 Mild persistent asthma, uncomplicated: Secondary | ICD-10-CM

## 2020-02-17 MED FILL — FLUTICASONE PROP 50 MCG SPR: 50 | 30 days supply | Qty: 16 | Fill #1

## 2020-02-17 MED FILL — ALBUTEROL SUL 2.5 MG/3 ML S: (2.5 MG/3ML | 2 days supply | Qty: 75 | Fill #2

## 2020-02-18 LAB — CBC
Hematocrit: 44.8 % (ref 37.5–51.0)
Hemoglobin: 15.1 g/dL (ref 13.0–17.7)
MCH: 27.2 pg (ref 26.6–33.0)
MCHC: 33.7 g/dL (ref 31.5–35.7)
MCV: 81 fL (ref 79–97)
Platelets: 259 10*3/uL (ref 150–450)
RBC: 5.56 x10E6/uL (ref 4.14–5.80)
RDW: 13.6 % (ref 11.6–15.4)
WBC: 8.8 10*3/uL (ref 3.4–10.8)

## 2020-02-18 LAB — CMP14+EGFR
ALT: 56 IU/L — ABNORMAL HIGH (ref 0–44)
AST: 23 IU/L (ref 0–40)
Albumin/Globulin Ratio: 2 (ref 1.2–2.2)
Albumin: 4.7 g/dL (ref 4.0–5.0)
Alkaline Phosphatase: 114 IU/L (ref 48–121)
BUN/Creatinine Ratio: 16 (ref 9–20)
BUN: 15 mg/dL (ref 6–20)
Bilirubin Total: 0.3 mg/dL (ref 0.0–1.2)
CO2: 24 mmol/L (ref 20–29)
Calcium: 9 mg/dL (ref 8.7–10.2)
Chloride: 103 mmol/L (ref 96–106)
Creatinine, Ser: 0.93 mg/dL (ref 0.76–1.27)
GFR calc Af Amer: 122 mL/min/{1.73_m2} (ref >59)
GFR calc non Af Amer: 105 mL/min/{1.73_m2} (ref >59)
Globulin, Total: 2.3 g/dL (ref 1.5–4.5)
Glucose: 92 mg/dL (ref 65–99)
Potassium: 4.5 mmol/L (ref 3.5–5.2)
Sodium: 141 mmol/L (ref 134–144)
Total Protein: 7 g/dL (ref 6.0–8.5)

## 2020-02-18 LAB — LIPID PANEL
Chol/HDL Ratio: 4.6 ratio (ref 0.0–5.0)
Cholesterol, Total: 196 mg/dL (ref 100–199)
HDL: 43 mg/dL (ref >39)
LDL Chol Calc (NIH): 130 mg/dL — ABNORMAL HIGH (ref 0–99)
Triglycerides: 130 mg/dL (ref 0–149)
VLDL Cholesterol Cal: 23 mg/dL (ref 5–40)

## 2020-02-18 LAB — HEMOGLOBIN A1C
Est. average glucose Bld gHb Est-mCnc: 134 mg/dL
Hgb A1c MFr Bld: 6.3 % — ABNORMAL HIGH (ref 4.8–5.6)

## 2020-02-18 MED FILL — MONTELUKAST SOD 10 MG TAB: 10 | 30 days supply | Qty: 30 | Fill #0

## 2020-02-24 ENCOUNTER — Ambulatory Visit: Payer: Self-pay

## 2020-02-24 ENCOUNTER — Other Ambulatory Visit: Payer: Self-pay

## 2020-03-11 ENCOUNTER — Other Ambulatory Visit: Payer: Self-pay | Admitting: Nurse Practitioner

## 2020-03-11 DIAGNOSIS — J4530 Mild persistent asthma, uncomplicated: Secondary | ICD-10-CM

## 2020-03-11 NOTE — Telephone Encounter (Signed)
Requested medication (s) are due for refill today: no  Requested medication (s) are on the active medication list:yes  Last refill:  01/07/2020  Future visit scheduled: no  Notes to clinic:   One inhaler should last at least one month. If the patient is requesting refills earlier, contact the patient to check for uncontrolled symptoms   Requested Prescriptions  Pending Prescriptions Disp Refills   VENTOLIN HFA 108 (90 Base) MCG/ACT inhaler [Pharmacy Med Name: VENTOLIN HFA 18G INHALER 108 (90 BAS Aerosol] 18 g 0    Sig: INHALE 2 PUFFS INTO THE LUNGS EVERY 6 HOURS AS NEEDED FOR WHEEZING OR SHORTNESS OF BREATH      Pulmonology:  Beta Agonists Failed - 03/11/2020  3:07 PM      Failed - One inhaler should last at least one month. If the patient is requesting refills earlier, contact the patient to check for uncontrolled symptoms.      Passed - Valid encounter within last 12 months    Recent Outpatient Visits           1 month ago Pharyngitis, unspecified etiology   [place], [place] W, NP   4 months ago Mild persistent asthma without complication   [place], [place], NP   7 months ago [place], Vernia Buff, NP   1 year ago [place], [place] W, NP   1 year ago Severe obesity (BMI 35.0-39.9) with comorbidity [facility])   [place], Vernia Buff, NP

## 2020-03-13 MED FILL — !VENTOLIN HFA INHALER: 108 (90 BAS | 25 days supply | Qty: 18 | Fill #0

## 2020-06-22 ENCOUNTER — Other Ambulatory Visit: Payer: Self-pay | Admitting: Nurse Practitioner

## 2020-06-22 DIAGNOSIS — J4530 Mild persistent asthma, uncomplicated: Secondary | ICD-10-CM

## 2020-06-22 MED FILL — MONTELUKAST SOD 10 MG TAB: 10 | 30 days supply | Qty: 30 | Fill #1

## 2020-06-22 MED FILL — ALBUTEROL SUL 2.5 MG/3 ML S: (2.5 MG/3ML | 2 days supply | Qty: 75 | Fill #3

## 2020-06-22 MED FILL — ?CETIRIZINE HCL 10 MG TABLE: 10 | 30 days supply | Qty: 30 | Fill #0

## 2020-06-22 MED FILL — FLUTICASONE PROP 50 MCG SPR: 50 | 30 days supply | Qty: 16 | Fill #2

## 2020-06-22 MED FILL — !ADVAIR 250/50 DISKUS: 250-50 | 30 days supply | Qty: 60 | Fill #1

## 2020-06-22 MED FILL — !VENTOLIN HFA INHALER: 108 (90 BAS | 25 days supply | Qty: 18 | Fill #0

## 2020-07-20 MED FILL — $ADVAIR 250/50 MCG INHALER: 250-50 | 90 days supply | Qty: 180 | Fill #2

## 2020-09-01 ENCOUNTER — Telehealth: Payer: Self-pay | Admitting: Nurse Practitioner

## 2020-09-01 NOTE — Telephone Encounter (Signed)
I called the Pt and schedule a financial appt

## 2020-09-01 NOTE — Telephone Encounter (Signed)
Copied from CRM [id number]. Topic: General - Other >> Aug 31, 2020 10:51 AM [name] wrote:  Pt would to schedule an appt for Financial Assistance with [name] / please advise   Last time patient seen was 6 months ago. Did not know if patient needed to be seen with his PCP or could schedule with any provider prior to recertifying for the OC. Please follow up.

## 2020-09-10 ENCOUNTER — Ambulatory Visit: Payer: Self-pay | Attending: Nurse Practitioner

## 2020-09-10 ENCOUNTER — Other Ambulatory Visit: Payer: Self-pay

## 2020-10-06 ENCOUNTER — Other Ambulatory Visit: Payer: Self-pay

## 2020-10-06 ENCOUNTER — Ambulatory Visit: Payer: Self-pay | Attending: Nurse Practitioner | Admitting: Nurse Practitioner

## 2020-10-06 ENCOUNTER — Encounter: Payer: Self-pay | Admitting: Nurse Practitioner

## 2020-10-06 ENCOUNTER — Other Ambulatory Visit: Payer: Self-pay | Admitting: Nurse Practitioner

## 2020-10-06 VITALS — BP 121/73 | HR 82 | Temp 99.3°F | Ht 69.0 in | Wt 279.0 lb

## 2020-10-06 DIAGNOSIS — J4550 Severe persistent asthma, uncomplicated: Secondary | ICD-10-CM

## 2020-10-06 DIAGNOSIS — Z114 Encounter for screening for human immunodeficiency virus [HIV]: Secondary | ICD-10-CM

## 2020-10-06 DIAGNOSIS — Z1159 Encounter for screening for other viral diseases: Secondary | ICD-10-CM

## 2020-10-06 DIAGNOSIS — Z130 Encounter for screening for diseases of the blood and blood-forming organs and certain disorders involving the immune mechanism: Secondary | ICD-10-CM

## 2020-10-06 DIAGNOSIS — Z6841 Body Mass Index (BMI) 40.0 and over, adult: Secondary | ICD-10-CM

## 2020-10-06 DIAGNOSIS — R7303 Prediabetes: Secondary | ICD-10-CM

## 2020-10-06 LAB — POCT GLYCOSYLATED HEMOGLOBIN (HGB A1C): Hemoglobin A1C: 5.8 % — AB (ref 4.0–5.6)

## 2020-10-06 LAB — GLUCOSE, POCT (MANUAL RESULT ENTRY): POC Glucose: 107 mg/dl — AB (ref 70–99)

## 2020-10-06 MED ORDER — PREDNISONE 20 MG PO TABS: 20.0000 mg | ORAL_TABLET | Freq: Every day | ORAL | 0 refills | Status: DC

## 2020-10-06 MED ORDER — ALBUTEROL SULFATE HFA 108 (90 BASE) MCG/ACT IN AERS: INHALATION_SPRAY | RESPIRATORY_TRACT | 0 refills | Status: DC

## 2020-10-06 MED FILL — ALBUTEROL SULFATE HFA 108 (: 108 (90 BAS | 25 days supply | Qty: 18 | Fill #0

## 2020-10-06 MED FILL — predniSONE 20 MG TABS: 20 | 7 days supply | Qty: 7 | Fill #0

## 2020-10-06 NOTE — Progress Notes (Signed)
Assessment & Plan:  Nicholas Sanford was seen today for follow-up.  Diagnoses and all orders for this visit:  Prediabetes -     Glucose (CBG) -     HgB A1c  Asthma, severe persistent, well-controlled -     predniSONE (DELTASONE) 20 MG tablet; Take 1 tablet (20 mg total) by mouth daily with breakfast for 7 days. -     albuterol (VENTOLIN HFA) 108 (90 Base) MCG/ACT inhaler; INHALE 2 PUFFS INTO THE LUNGS EVERY 6 HOURS AS NEEDED FOR WHEEZING OR SHORTNESS OF BREATH  Morbid obesity with BMI of 40.0-44.9, adult (HCC) -     CMP14+EGFR -     Lipid panel  Need for hepatitis C screening test -     HCV Ab w Reflex to Quant PCR  Encounter for screening for HIV -     HIV antibody (with reflex)  Screening for deficiency anemia -     CBC     Patient has been counseled on age-appropriate routine health concerns for screening and prevention. These are reviewed and up-to-date. Referrals have been placed accordingly. Immunizations are up-to-date or declined.    Subjective:   Chief Complaint  Patient presents with  . Follow-up    Patient is here for asthma follow up.    HPI Nicholas Sanford 36 y.o. male presents to office today for follow up. He is requesting refill of his SABA.  VRI was used to communicate directly with patient for the entire encounter including providing detailed patient instructions.   Asthma He endorses increased wheezing in his chest today. Has been out of his albuterol inhaler for over a month. Using his advair and nebulizers as prescribed. Taking zyrtec and singulair daily.  He denies cough, fever or worsening shortness of breath.   Prediabetes Well-controlled without the use of any oral diabetic agents at this time. Lab Results  Component Value Date   HGBA1C 5.8 (A) 10/06/2020   Lab Results  Component Value Date   HGBA1C 6.3 (H) 02/17/2020      Review of Systems  Constitutional: Negative for fever, malaise/fatigue and weight loss.  HENT: Negative.  Negative  for nosebleeds.   Eyes: Negative.  Negative for blurred vision, double vision and photophobia.  Respiratory: Positive for wheezing. Negative for cough and shortness of breath.   Cardiovascular: Negative.  Negative for chest pain, palpitations and leg swelling.  Gastrointestinal: Negative.  Negative for heartburn, nausea and vomiting.  Musculoskeletal: Negative.  Negative for myalgias.  Neurological: Negative.  Negative for dizziness, focal weakness, seizures and headaches.  Psychiatric/Behavioral: Negative.  Negative for suicidal ideas.    Past Medical History:  Diagnosis Date  . Allergy    sesonal   . Asthma    as a child     Past Surgical History:  Procedure Laterality Date  . APPENDECTOMY  2014  . LAPAROSCOPIC APPENDECTOMY N/A 08/11/2013   Procedure: APPENDECTOMY LAPAROSCOPIC;  Surgeon: Shann Medal, MD;  Location: [facility];  Service: General;  Laterality: N/A;    Family History  Problem Relation Age of Onset  . Asthma Mother     Social History Reviewed with no changes to be made today.   Outpatient Medications Prior to Visit  Medication Sig Dispense Refill  . albuterol (PROVENTIL) (2.5 MG/3ML) 0.083% nebulizer solution Take 6 mLs (5 mg total) by nebulization every 4 (four) hours as needed for wheezing or shortness of breath. 75 mL 11  . cetirizine (ZYRTEC) 10 MG tablet TAKE 1 TABLET (10 MG TOTAL) BY  MOUTH DAILY. 30 tablet 11  . menthol-cetylpyridinium (CEPACOL REGULAR STRENGTH) 3 MG lozenge Take 1 lozenge (3 mg total) by mouth as needed for sore throat. 100 tablet 0  . montelukast (SINGULAIR) 10 MG tablet TAKE 1 TABLET (10 MG TOTAL) BY MOUTH AT BEDTIME. 30 tablet 11  . albuterol (VENTOLIN HFA) 108 (90 Base) MCG/ACT inhaler INHALE 2 PUFFS INTO THE LUNGS EVERY 6 HOURS AS NEEDED FOR WHEEZING OR SHORTNESS OF BREATH 18 g 0  . ADVAIR DISKUS 250-50 MCG/DOSE AEPB Inhale 1 puff into the lungs 2 (two) times daily. (Patient not taking: Reported on 10/06/2020) 180 each 2   No  facility-administered medications prior to visit.    Allergies  Allergen Reactions  . Penicillins Itching       Objective:    BP 121/73 (BP Location: Left Arm, Patient Position: Sitting, Cuff Size: Large)   Pulse 82   Temp 99.3 F (37.4 C) (Oral)   Ht 5' 9" (1.753 m)   Wt 279 lb (126.6 kg)   SpO2 96%   BMI 41.20 kg/m  Wt Readings from Last 3 Encounters:  10/06/20 279 lb (126.6 kg)  11/04/19 280 lb 8 oz (127.2 kg)  11/12/18 264 lb (119.7 kg)    Physical Exam Vitals and nursing note reviewed.  Constitutional:      Appearance: He is well-developed and well-nourished.  HENT:     Head: Normocephalic and atraumatic.  Eyes:     Extraocular Movements: EOM normal.  Cardiovascular:     Rate and Rhythm: Normal rate and regular rhythm.     Pulses: Intact distal pulses.     Heart sounds: Normal heart sounds. No murmur heard. No friction rub. No gallop.   Pulmonary:     Effort: Pulmonary effort is normal. No tachypnea or respiratory distress.     Breath sounds: Examination of the right-upper field reveals wheezing. Examination of the left-upper field reveals wheezing. Examination of the right-middle field reveals wheezing. Examination of the left-middle field reveals wheezing. Examination of the right-lower field reveals decreased breath sounds. Examination of the left-lower field reveals decreased breath sounds. Decreased breath sounds and wheezing present. No rhonchi or rales.  Chest:     Chest wall: No tenderness.  Abdominal:     General: Bowel sounds are normal.     Palpations: Abdomen is soft.  Musculoskeletal:        General: No edema. Normal range of motion.     Cervical back: Normal range of motion.  Skin:    General: Skin is warm and dry.  Neurological:     Mental Status: He is alert and oriented to person, place, and time.     Coordination: Coordination normal.  Psychiatric:        Mood and Affect: Mood and affect normal.        Behavior: Behavior normal. Behavior  is cooperative.        Thought Content: Thought content normal.        Judgment: Judgment normal.          Patient has been counseled extensively about nutrition and exercise as well as the importance of adherence with medications and regular follow-up. The patient was given clear instructions to go to ER or return to medical center if symptoms don't improve, worsen or new problems develop. The patient verbalized understanding.   Follow-up: Return in about 6 months (around 04/05/2021).   Gildardo Pounds, FNP-BC [facility] [place], [place]  10/06/2020, 3:16 PM

## 2020-10-07 LAB — LIPID PANEL
Chol/HDL Ratio: 5 ratio (ref 0.0–5.0)
Cholesterol, Total: 194 mg/dL (ref 100–199)
HDL: 39 mg/dL — ABNORMAL LOW
LDL Chol Calc (NIH): 106 mg/dL — ABNORMAL HIGH (ref 0–99)
Triglycerides: 284 mg/dL — ABNORMAL HIGH (ref 0–149)
VLDL Cholesterol Cal: 49 mg/dL — ABNORMAL HIGH (ref 5–40)

## 2020-10-07 LAB — CBC
Hematocrit: 47.6 % (ref 37.5–51.0)
Hemoglobin: 15.6 g/dL (ref 13.0–17.7)
MCH: 26.6 pg (ref 26.6–33.0)
MCHC: 32.8 g/dL (ref 31.5–35.7)
MCV: 81 fL (ref 79–97)
Platelets: 256 10*3/uL (ref 150–450)
RBC: 5.86 x10E6/uL — ABNORMAL HIGH (ref 4.14–5.80)
RDW: 13.2 % (ref 11.6–15.4)
WBC: 9.3 10*3/uL (ref 3.4–10.8)

## 2020-10-07 LAB — CMP14+EGFR
ALT: 70 IU/L — ABNORMAL HIGH (ref 0–44)
AST: 26 IU/L (ref 0–40)
Albumin/Globulin Ratio: 2 (ref 1.2–2.2)
Albumin: 4.9 g/dL (ref 4.0–5.0)
Alkaline Phosphatase: 107 IU/L (ref 44–121)
BUN/Creatinine Ratio: 16 (ref 9–20)
BUN: 13 mg/dL (ref 6–20)
Bilirubin Total: 0.3 mg/dL (ref 0.0–1.2)
CO2: 25 mmol/L (ref 20–29)
Calcium: 9.6 mg/dL (ref 8.7–10.2)
Chloride: 100 mmol/L (ref 96–106)
Creatinine, Ser: 0.8 mg/dL (ref 0.76–1.27)
GFR calc Af Amer: 132 mL/min/{1.73_m2} (ref >59)
GFR calc non Af Amer: 114 mL/min/{1.73_m2} (ref >59)
Globulin, Total: 2.4 g/dL (ref 1.5–4.5)
Glucose: 86 mg/dL (ref 65–99)
Potassium: 4 mmol/L (ref 3.5–5.2)
Sodium: 140 mmol/L (ref 134–144)
Total Protein: 7.3 g/dL (ref 6.0–8.5)

## 2020-10-07 LAB — HCV AB W REFLEX TO QUANT PCR: HCV Ab: 0.1 s/co ratio (ref 0.0–0.9)

## 2020-10-07 LAB — HCV INTERPRETATION

## 2020-10-07 LAB — HIV ANTIBODY (ROUTINE TESTING W REFLEX): HIV Screen 4th Generation wRfx: NONREACTIVE

## 2020-12-29 ENCOUNTER — Other Ambulatory Visit: Payer: Self-pay

## 2021-01-11 ENCOUNTER — Other Ambulatory Visit: Payer: Self-pay | Admitting: Nurse Practitioner

## 2021-01-11 ENCOUNTER — Other Ambulatory Visit: Payer: Self-pay

## 2021-01-11 DIAGNOSIS — J4530 Mild persistent asthma, uncomplicated: Secondary | ICD-10-CM

## 2021-01-11 MED ORDER — ALBUTEROL SULFATE HFA 108 (90 BASE) MCG/ACT IN AERS: INHALATION_SPRAY | RESPIRATORY_TRACT | 0 refills | Status: DC

## 2021-01-11 MED FILL — Cetirizine HCl Tab 10 MG: ORAL | 30 days supply | Qty: 30 | Fill #0 | Status: CN

## 2021-01-11 MED FILL — Montelukast Sodium Tab 10 MG (Base Equiv): ORAL | 30 days supply | Qty: 30 | Fill #0 | Status: AC

## 2021-01-11 MED FILL — Cetirizine HCl Tab 10 MG: ORAL | 30 days supply | Qty: 30 | Fill #0 | Status: AC

## 2021-01-13 ENCOUNTER — Other Ambulatory Visit: Payer: Self-pay

## 2021-02-16 ENCOUNTER — Other Ambulatory Visit: Payer: Self-pay

## 2021-02-17 ENCOUNTER — Other Ambulatory Visit: Payer: Self-pay | Admitting: Nurse Practitioner

## 2021-02-17 ENCOUNTER — Other Ambulatory Visit: Payer: Self-pay

## 2021-02-17 DIAGNOSIS — J4550 Severe persistent asthma, uncomplicated: Secondary | ICD-10-CM

## 2021-02-17 DIAGNOSIS — J4530 Mild persistent asthma, uncomplicated: Secondary | ICD-10-CM

## 2021-02-17 MED ORDER — ALBUTEROL SULFATE HFA 108 (90 BASE) MCG/ACT IN AERS
2.0000 | INHALATION_SPRAY | Freq: Four times a day (QID) | RESPIRATORY_TRACT | 0 refills | Status: DC | PRN
  Filled 2021-02-17: qty 18, 25d supply, fill #0

## 2021-02-17 MED ORDER — ALBUTEROL SULFATE (2.5 MG/3ML) 0.083% IN NEBU
5.0000 mg | INHALATION_SOLUTION | RESPIRATORY_TRACT | 0 refills | Status: DC | PRN
  Filled 2021-02-17: qty 75, 4d supply, fill #0

## 2021-02-17 NOTE — Telephone Encounter (Signed)
   Notes to clinic: scripts requested have expired Review for continued use and refill    Requested Prescriptions  Pending Prescriptions Disp Refills   albuterol (VENTOLIN HFA) 108 (90 Base) MCG/ACT inhaler 18 g 0    Sig: INHALE 2 PUFFS INTO THE LUNGS EVERY 6 HOURS AS NEEDED FOR WHEEZING OR SHORTNESS OF BREATH      Pulmonology:  Beta Agonists Failed - 02/17/2021  8:40 AM      Failed - One inhaler should last at least one month. If the patient is requesting refills earlier, contact the patient to check for uncontrolled symptoms.      Passed - Valid encounter within last 12 months    Recent Outpatient Visits           4 months ago Prediabetes   [place], Vernia Buff, NP   1 year ago Pharyngitis, unspecified etiology   [place], Vernia Buff, NP   1 year ago Mild persistent asthma without complication   [place], [place], NP   1 year ago [place], [place] W, NP   2 years ago [place], Vernia Buff, NP       Future Appointments             In 3 weeks Gildardo Pounds, NP [place]               albuterol (PROVENTIL) (2.5 MG/3ML) 0.083% nebulizer solution 75 mL 11    Sig: Take 6 mLs (5 mg total) by nebulization every 4 (four) hours as needed for wheezing or shortness of breath.      Pulmonology:  Beta Agonists Failed - 02/17/2021  8:40 AM      Failed - One inhaler should last at least one month. If the patient is requesting refills earlier, contact the patient to check for uncontrolled symptoms.      Passed - Valid encounter within last 12 months    Recent Outpatient Visits           4 months ago Prediabetes   [place], Vernia Buff, NP   1 year ago Pharyngitis, unspecified etiology   [place], Vernia Buff, NP   1 year ago Mild persistent asthma without complication   [place], [place], NP   1 year ago [place], Vernia Buff, NP   2 years ago [place] Gildardo Pounds, NP       Future Appointments             In 3 weeks Gildardo Pounds, NP [place]

## 2021-02-17 NOTE — Telephone Encounter (Signed)
Medication Refill - Medication: albuterol (PROVENTIL) (2.5 MG/3ML) 0.083% nebulizer solution albuterol (VENTOLIN HFA) 108 (90 Base) MCG/ACT inhaler     Preferred Pharmacy (with phone number or street name):  [facility] [place] Phone:  [phone]  Fax:  [phone]      Agent: Please be advised that RX refills may take up to 3 business days. We ask that you follow-up with your pharmacy.

## 2021-02-18 ENCOUNTER — Other Ambulatory Visit: Payer: Self-pay

## 2021-03-15 ENCOUNTER — Ambulatory Visit: Payer: Self-pay | Attending: Nurse Practitioner | Admitting: Nurse Practitioner

## 2021-03-15 ENCOUNTER — Other Ambulatory Visit: Payer: Self-pay | Admitting: Nurse Practitioner

## 2021-03-15 ENCOUNTER — Encounter: Payer: Self-pay | Admitting: Nurse Practitioner

## 2021-03-15 ENCOUNTER — Other Ambulatory Visit: Payer: Self-pay

## 2021-03-15 DIAGNOSIS — R748 Abnormal levels of other serum enzymes: Secondary | ICD-10-CM

## 2021-03-15 DIAGNOSIS — E785 Hyperlipidemia, unspecified: Secondary | ICD-10-CM

## 2021-03-15 DIAGNOSIS — K089 Disorder of teeth and supporting structures, unspecified: Secondary | ICD-10-CM

## 2021-03-15 DIAGNOSIS — R7303 Prediabetes: Secondary | ICD-10-CM

## 2021-03-15 DIAGNOSIS — J4530 Mild persistent asthma, uncomplicated: Secondary | ICD-10-CM

## 2021-03-15 MED ORDER — CETIRIZINE HCL 10 MG PO TABS
ORAL_TABLET | Freq: Every day | ORAL | 11 refills | Status: DC
  Filled 2021-03-15: qty 30, 30d supply, fill #0

## 2021-03-15 MED ORDER — MONTELUKAST SODIUM 10 MG PO TABS
ORAL_TABLET | Freq: Every day | ORAL | 11 refills | Status: DC
  Filled 2021-03-15: qty 30, 30d supply, fill #0

## 2021-03-15 MED ORDER — NYSTATIN 100000 UNIT/ML MT SUSP
5.0000 mL | Freq: Four times a day (QID) | OROMUCOSAL | 0 refills | Status: AC
  Filled 2021-03-15: qty 140, 7d supply, fill #0

## 2021-03-15 NOTE — Progress Notes (Signed)
Virtual Visit via Telephone Note Due to national recommendations of social distancing due to [place] 19, telehealth visit is felt to be most appropriate for this patient at this time.  I discussed the limitations, risks, security and privacy concerns of performing an evaluation and management service by telephone and the availability of in person appointments. I also discussed with the patient that there may be a patient responsible charge related to this service. The patient expressed understanding and agreed to proceed.    I connected with Darcey Benavides on 03/15/21  at   2:10 PM EDT  EDT by telephone and verified that I am speaking with the correct person using two identifiers.  Location of Patient: Private Residence   Location of Provider: [place] [facility] [place] participating in Telemedicine visit: Geryl Rankins FNP-BC [place]  [place] interpreter Trudee Grip [phone]   History of Present Illness: Telemedicine visit for: Follow up Asthma  Asthma Endorses discomfort in his throat. States he has very large tonsils and his throat often gets sore.  Denies cough, wheezing or shortness of breath. Taking ventolin inhaler and proventil nebs as needed. Uses advair as prescribed as well as zyrtec and singulair.    Past Medical History:  Diagnosis Date   Allergy    sesonal    Asthma    as a child     Past Surgical History:  Procedure Laterality Date   APPENDECTOMY  2014   LAPAROSCOPIC APPENDECTOMY N/A 08/11/2013   Procedure: APPENDECTOMY LAPAROSCOPIC;  Surgeon: Shann Medal, MD;  Location: [facility];  Service: General;  Laterality: N/A;    Family History  Problem Relation Age of Onset   Asthma Mother     Social History   Socioeconomic History   Marital status: Single    Spouse name: n/a   Number of children: 2   Years of education: Not on file   Highest education level: Not on file  Occupational History   Occupation: unemployed     Employer: NOT EMPLIOYED    Comment: Public relations account executive  Tobacco Use   Smoking status: Never   Smokeless tobacco: Never  Vaping Use   Vaping Use: Never used  Substance and Sexual Activity   Alcohol use: No    Alcohol/week: 0.0 standard drinks   Drug use: No   Sexual activity: Yes  Other Topics Concern   Not on file  Social History Narrative   From [place].  Came to the [place] in 2002.  Lives with his girlfriend and their child, and his child from a previous relationship.   Social Determinants of Health   Financial Resource Strain: Not on file  Food Insecurity: Not on file  Transportation Needs: Not on file  Physical Activity: Not on file  Stress: Not on file  Social Connections: Not on file     Observations/Objective: Awake, alert and oriented x 3   ROS  Assessment and Plan: Diagnoses and all orders for this visit:  Mild persistent asthma without complication -     nystatin (MYCOSTATIN) 100000 UNIT/ML suspension; Use as directed 5 mLs (500,000 Units total) in the mouth or throat 4 (four) times daily for 7 days. swish in the mouth and retain for as long as possible (several minutes) before swallowing. Duration is for 7 to 14 days Treating today for possible thrush -     montelukast (SINGULAIR) 10 MG tablet; TAKE 1 TABLET (10 MG TOTAL) BY MOUTH AT BEDTIME. -     cetirizine (  ZYRTEC) 10 MG tablet; TAKE 1 TABLET (10 MG TOTAL) BY MOUTH DAILY.  Elevated liver enzymes -     CMP14+EGFR; Future  Prediabetes -     Hemoglobin A1c; Future Well controlled.   Dyslipidemia, goal LDL below 100 -     Lipid panel; Future INSTRUCTIONS: Work on a low fat, heart healthy diet and participate in regular aerobic exercise program by working out at least 150 minutes per week; 5 days a week-30 minutes per day. Avoid red meat/beef/steak,  fried foods. junk foods, sodas, sugary drinks, unhealthy snacking, alcohol and smoking.  Drink at least 80 oz of water per day and monitor your carbohydrate  intake daily.      Follow Up Instructions Return in about 3 months (around 06/15/2021) for Physical    I discussed the assessment and treatment plan with the patient. The patient was provided an opportunity to ask questions and all were answered. The patient agreed with the plan and demonstrated an understanding of the instructions.   The patient was advised to call back or seek an in-person evaluation if the symptoms worsen or if the condition fails to improve as anticipated.  I provided 14 minutes of non-face-to-face time during this encounter including median intraservice time, reviewing previous notes, labs, imaging, medications and explaining diagnosis and management.  Gildardo Pounds, FNP-BC

## 2021-03-15 NOTE — Addendum Note (Signed)
Addended bySteffanie Dunn on: 03/15/2021 03:31 PM   Modules accepted: Orders

## 2021-03-16 ENCOUNTER — Other Ambulatory Visit: Payer: Self-pay

## 2021-03-16 LAB — LIPID PANEL
Chol/HDL Ratio: 4.7 ratio (ref 0.0–5.0)
Cholesterol, Total: 183 mg/dL (ref 100–199)
HDL: 39 mg/dL — ABNORMAL LOW (ref >39)
LDL Chol Calc (NIH): 110 mg/dL — ABNORMAL HIGH (ref 0–99)
Triglycerides: 195 mg/dL — ABNORMAL HIGH (ref 0–149)
VLDL Cholesterol Cal: 34 mg/dL (ref 5–40)

## 2021-03-16 LAB — CMP14+EGFR
ALT: 32 IU/L (ref 0–44)
AST: 16 IU/L (ref 0–40)
Albumin/Globulin Ratio: 2.3 — ABNORMAL HIGH (ref 1.2–2.2)
Albumin: 4.8 g/dL (ref 4.0–5.0)
Alkaline Phosphatase: 115 IU/L (ref 44–121)
BUN/Creatinine Ratio: 21 — ABNORMAL HIGH (ref 9–20)
BUN: 15 mg/dL (ref 6–20)
Bilirubin Total: 0.2 mg/dL (ref 0.0–1.2)
CO2: 24 mmol/L (ref 20–29)
Calcium: 9.5 mg/dL (ref 8.7–10.2)
Chloride: 101 mmol/L (ref 96–106)
Creatinine, Ser: 0.73 mg/dL — ABNORMAL LOW (ref 0.76–1.27)
Globulin, Total: 2.1 g/dL (ref 1.5–4.5)
Glucose: 85 mg/dL (ref 65–99)
Potassium: 4.3 mmol/L (ref 3.5–5.2)
Sodium: 142 mmol/L (ref 134–144)
Total Protein: 6.9 g/dL (ref 6.0–8.5)
eGFR: 120 mL/min/{1.73_m2} (ref >59)

## 2021-03-16 LAB — HEMOGLOBIN A1C
Est. average glucose Bld gHb Est-mCnc: 128 mg/dL
Hgb A1c MFr Bld: 6.1 % — ABNORMAL HIGH (ref 4.8–5.6)

## 2021-03-24 ENCOUNTER — Ambulatory Visit: Payer: Self-pay | Attending: Nurse Practitioner

## 2021-03-24 ENCOUNTER — Other Ambulatory Visit: Payer: Self-pay

## 2021-11-25 ENCOUNTER — Telehealth: Payer: Self-pay | Admitting: Nurse Practitioner

## 2021-11-25 NOTE — Telephone Encounter (Signed)
I return Pt call, he will be send the application to apply by mail for the financial program ?

## 2021-11-25 NOTE — Telephone Encounter (Signed)
Copied from [place] [id number]. Topic: Appointment Scheduling - Scheduling Inquiry for Clinic ?>> Nov 23, 2021  3:37 PM Yvette Rack wrote: ?Reason for CRM: Pt would like to schedule appt for orange card. Cb# [phone] ?

## 2021-12-20 ENCOUNTER — Ambulatory Visit: Payer: Self-pay | Attending: Nurse Practitioner | Admitting: Nurse Practitioner

## 2021-12-20 ENCOUNTER — Other Ambulatory Visit: Payer: Self-pay

## 2021-12-20 ENCOUNTER — Other Ambulatory Visit: Payer: Self-pay | Admitting: Pharmacist

## 2021-12-20 ENCOUNTER — Encounter: Payer: Self-pay | Admitting: Nurse Practitioner

## 2021-12-20 VITALS — BP 98/61 | HR 77 | Temp 98.5°F | Resp 18 | Ht 69.02 in | Wt 265.0 lb

## 2021-12-20 DIAGNOSIS — J4530 Mild persistent asthma, uncomplicated: Secondary | ICD-10-CM

## 2021-12-20 DIAGNOSIS — D229 Melanocytic nevi, unspecified: Secondary | ICD-10-CM

## 2021-12-20 DIAGNOSIS — R7303 Prediabetes: Secondary | ICD-10-CM

## 2021-12-20 DIAGNOSIS — E785 Hyperlipidemia, unspecified: Secondary | ICD-10-CM

## 2021-12-20 DIAGNOSIS — R748 Abnormal levels of other serum enzymes: Secondary | ICD-10-CM

## 2021-12-20 DIAGNOSIS — Z0000 Encounter for general adult medical examination without abnormal findings: Secondary | ICD-10-CM

## 2021-12-20 MED ORDER — CETIRIZINE HCL 10 MG PO TABS
ORAL_TABLET | Freq: Every day | ORAL | 1 refills | Status: DC
Start: 2021-12-20 — End: 2022-08-10
  Filled 2021-12-20: qty 90, fill #0
  Filled 2022-03-17: qty 30, 30d supply, fill #0

## 2021-12-20 MED ORDER — MONTELUKAST SODIUM 10 MG PO TABS
ORAL_TABLET | Freq: Every day | ORAL | 1 refills | Status: DC
  Filled 2021-12-20 – 2022-03-17 (×2): qty 30, 30d supply, fill #0

## 2021-12-20 MED ORDER — FLUTICASONE-SALMETEROL 250-50 MCG/ACT IN AEPB
1.0000 | INHALATION_SPRAY | Freq: Two times a day (BID) | RESPIRATORY_TRACT | 6 refills | Status: DC
  Filled 2021-12-20: qty 60, 30d supply, fill #0

## 2021-12-20 MED ORDER — ALBUTEROL SULFATE (2.5 MG/3ML) 0.083% IN NEBU
5.0000 mg | INHALATION_SOLUTION | RESPIRATORY_TRACT | 3 refills | Status: DC | PRN
  Filled 2021-12-20 – 2022-03-17 (×2): qty 75, 2d supply, fill #0
  Filled 2022-12-07: qty 90, 3d supply, fill #1

## 2021-12-20 MED ORDER — FLUTICASONE PROPIONATE 50 MCG/ACT NA SUSP
2.0000 | Freq: Every day | NASAL | 1 refills | Status: DC
  Filled 2021-12-20 – 2022-03-17 (×2): qty 16, 30d supply, fill #0
  Filled 2022-12-07: qty 16, 30d supply, fill #1

## 2021-12-20 MED ORDER — FLUTICASONE FUROATE-VILANTEROL 100-25 MCG/ACT IN AEPB
1.0000 | INHALATION_SPRAY | Freq: Every day | RESPIRATORY_TRACT | 2 refills | Status: DC
  Filled 2021-12-20 – 2022-03-17 (×2): qty 60, 30d supply, fill #0
  Filled 2022-12-07: qty 60, 30d supply, fill #1

## 2021-12-20 NOTE — Progress Notes (Signed)
? ?Assessment & Plan:  ?Shaman was seen today for annual exam. ? ?Diagnoses and all orders for this visit: ? ?Encounter for annual physical exam ? ?Mild persistent asthma without complication ?-     Discontinue: fluticasone-salmeterol (ADVAIR DISKUS) 250-50 MCG/ACT AEPB; Inhale 1 puff into the lungs in the morning and at bedtime. ?-     montelukast (SINGULAIR) 10 MG tablet; TAKE 1 TABLET (10 MG TOTAL) BY MOUTH AT BEDTIME. ?-     cetirizine (ZYRTEC) 10 MG tablet; TAKE 1 TABLET (10 MG TOTAL) BY MOUTH DAILY. ?-     fluticasone (FLONASE) 50 MCG/ACT nasal spray; Place 2 sprays into both nostrils daily. ?-     albuterol (PROVENTIL) (2.5 MG/3ML) 0.083% nebulizer solution; Take 6 mLs (5 mg total) by nebulization every 4 (four) hours as needed for wheezing or shortness of breath. ? ?Elevated liver enzymes ?-     CMP14+EGFR ? ?Dyslipidemia, goal LDL below 100 ?-     Lipid panel ? ?Prediabetes ?-     Hemoglobin A1c ? ?Nevus ?-     Ambulatory referral to Dermatology ? ? ? ?Patient has been counseled on age-appropriate routine health concerns for screening and prevention. These are reviewed and up-to-date. Referrals have been placed accordingly. Immunizations are up-to-date or declined.    ?Subjective:  ? ?Chief Complaint  ?Patient presents with  ? Annual Exam  ? ?HPI ?Nicholas Sanford 36 y.o. male presents to office today for annual physical exam.  ? ? ?Requesting refill of asthma medications today. Denies any recent symptoms of exacerbation.  ? ?Prediabetes ?Well controlled with diet only at this time.  ?Lab Results  ?Component Value Date  ? HGBA1C 6.1 (H) 03/15/2021  ?  ?Derm ?He has a mole behind the upper left earlobe. States sometime there is fluid he can express from it and it has increased in size since he initially noticed it.  ? ?Review of Systems  ?Constitutional:  Negative for fever, malaise/fatigue and weight loss.  ?HENT: Negative.  Negative for nosebleeds.   ?Eyes: Negative.  Negative for blurred vision, double  vision and photophobia.  ?Respiratory: Negative.  Negative for cough and shortness of breath.   ?Cardiovascular: Negative.  Negative for chest pain, palpitations and leg swelling.  ?Gastrointestinal: Negative.  Negative for heartburn, nausea and vomiting.  ?Genitourinary: Negative.   ?Musculoskeletal: Negative.  Negative for myalgias.  ?Skin: Negative.   ?Neurological: Negative.  Negative for dizziness, focal weakness, seizures and headaches.  ?Endo/Heme/Allergies: Negative.   ?Psychiatric/Behavioral: Negative.  Negative for suicidal ideas.   ? ?Past Medical History:  ?Diagnosis Date  ? Allergy   ? sesonal   ? Asthma   ? as a child   ? ? ?Past Surgical History:  ?Procedure Laterality Date  ? APPENDECTOMY  2014  ? LAPAROSCOPIC APPENDECTOMY N/A 08/11/2013  ? Procedure: APPENDECTOMY LAPAROSCOPIC;  Surgeon: Shann Medal, MD;  Location: [facility];  Service: General;  Laterality: N/A;  ? ? ?Family History  ?Problem Relation Age of Onset  ? Asthma Mother   ? ? ?Social History Reviewed with no changes to be made today.  ? ?Outpatient Medications Prior to Visit  ?Medication Sig Dispense Refill  ? albuterol (VENTOLIN HFA) 108 (90 Base) MCG/ACT inhaler INHALE 2 PUFFS INTO THE LUNGS EVERY 6 HOURS AS NEEDED FOR WHEEZING OR SHORTNESS OF BREATH 18 g 0  ? menthol-cetylpyridinium (CEPACOL REGULAR STRENGTH) 3 MG lozenge Take 1 lozenge (3 mg total) by mouth as needed for sore throat. 100 tablet 0  ?  ADVAIR DISKUS 250-50 MCG/DOSE AEPB Inhale 1 puff into the lungs 2 (two) times daily. (Patient not taking: Reported on 10/06/2020) 180 each 2  ? albuterol (PROVENTIL) (2.5 MG/3ML) 0.083% nebulizer solution Take 6 mLs (5 mg total) by nebulization every 4 (four) hours as needed for wheezing or shortness of breath. 75 mL 0  ? cetirizine (ZYRTEC) 10 MG tablet TAKE 1 TABLET (10 MG TOTAL) BY MOUTH DAILY. 30 tablet 11  ? montelukast (SINGULAIR) 10 MG tablet TAKE 1 TABLET (10 MG TOTAL) BY MOUTH AT BEDTIME. 30 tablet 11  ? ?No facility-administered  medications prior to visit.  ? ? ?Allergies  ?Allergen Reactions  ? Penicillins Itching  ? ? ?   ?Objective:  ?  ?BP 98/61 (BP Location: Left Arm, Patient Position: Sitting, Cuff Size: Large)   Pulse 77   Temp 98.5 ?F (36.9 ?C)   Resp 18   Ht 5' 9.02" (1.753 m)   Wt 265 lb (120.2 kg)   SpO2 98%   BMI 39.12 kg/m?  ?Wt Readings from Last 3 Encounters:  ?12/20/21 265 lb (120.2 kg)  ?10/06/20 279 lb (126.6 kg)  ?11/04/19 280 lb 8 oz (127.2 kg)  ? ? ?Physical Exam ?Constitutional:   ?   Appearance: He is well-developed.  ?HENT:  ?   Head: Normocephalic and atraumatic.  ? ?   Right Ear: Hearing, tympanic membrane, ear canal and external ear normal.  ?   Left Ear: Hearing, tympanic membrane, ear canal and external ear normal.  ?   Nose: Nose normal. No mucosal edema or rhinorrhea.  ?   Right Turbinates: Not enlarged.  ?   Left Turbinates: Not enlarged.  ?   Mouth/Throat:  ?   Lips: Pink.  ?   Mouth: Mucous membranes are moist.  ?   Dentition: No gingival swelling, dental abscesses or gum lesions.  ?   Pharynx: Uvula midline.  ?   Tonsils: No tonsillar exudate. 1+ on the right. 1+ on the left.  ?Eyes:  ?   General: Lids are normal. No scleral icterus. ?   Extraocular Movements: Extraocular movements intact.  ?   Conjunctiva/sclera: Conjunctivae normal.  ?   Pupils: Pupils are equal, round, and reactive to light.  ?Neck:  ?   Thyroid: No thyromegaly.  ?   Trachea: No tracheal deviation.  ?Cardiovascular:  ?   Rate and Rhythm: Normal rate and regular rhythm.  ?   Heart sounds: Normal heart sounds. No murmur heard. ?  No friction rub. No gallop.  ?Pulmonary:  ?   Effort: Pulmonary effort is normal. No respiratory distress.  ?   Breath sounds: Normal breath sounds. No wheezing or rales.  ?Chest:  ?   Chest wall: No mass or tenderness.  ?Breasts: ?   Right: No inverted nipple, mass, nipple discharge, skin change or tenderness.  ?   Left: No inverted nipple, mass, nipple discharge, skin change or tenderness.  ?Abdominal:   ?   General: Bowel sounds are normal. There is no distension.  ?   Palpations: Abdomen is soft. There is no mass.  ?   Tenderness: There is no abdominal tenderness. There is no guarding or rebound.  ?Musculoskeletal:     ?   General: No tenderness or deformity. Normal range of motion.  ?   Cervical back: Normal range of motion and neck supple.  ?Lymphadenopathy:  ?   Cervical: No cervical adenopathy.  ?Skin: ?   General: Skin is warm and dry.  ?  Capillary Refill: Capillary refill takes less than 2 seconds.  ?   Findings: No erythema.  ?Neurological:  ?   Mental Status: He is alert and oriented to person, place, and time.  ?   Cranial Nerves: No cranial nerve deficit.  ?   Sensory: Sensation is intact.  ?   Motor: No abnormal muscle tone.  ?   Coordination: Coordination is intact. Coordination normal.  ?   Gait: Gait is intact.  ?   Deep Tendon Reflexes: Reflexes normal.  ?   Reflex Scores: ?     Patellar reflexes are 1+ on the right side and 1+ on the left side. ?Psychiatric:     ?   Attention and Perception: Attention normal.     ?   Mood and Affect: Mood normal.     ?   Speech: Speech normal.     ?   Behavior: Behavior normal.     ?   Thought Content: Thought content normal.     ?   Judgment: Judgment normal.  ? ? ? ? ?   ?Patient has been counseled extensively about nutrition and exercise as well as the importance of adherence with medications and regular follow-up. The patient was given clear instructions to go to ER or return to medical center if symptoms don't improve, worsen or new problems develop. The patient verbalized understanding.  ? ?Follow-up: No follow-ups on file.  ? ?Gildardo Pounds, FNP-BC ?[facility] ?[place], [place] ?[phone]   ?12/20/2021, 9:13 PM ?

## 2021-12-20 NOTE — Progress Notes (Signed)
Pt presents for annual physical exam,  ?needs refill on Advair Diskus, Cetirizine, and  Montelukast  ?

## 2021-12-21 LAB — CMP14+EGFR
ALT: 39 IU/L (ref 0–44)
AST: 23 IU/L (ref 0–40)
Albumin/Globulin Ratio: 2.3 — ABNORMAL HIGH (ref 1.2–2.2)
Albumin: 4.9 g/dL (ref 4.0–5.0)
Alkaline Phosphatase: 113 IU/L (ref 44–121)
BUN/Creatinine Ratio: 21 — ABNORMAL HIGH (ref 9–20)
BUN: 16 mg/dL (ref 6–20)
Bilirubin Total: 0.3 mg/dL (ref 0.0–1.2)
CO2: 24 mmol/L (ref 20–29)
Calcium: 9.3 mg/dL (ref 8.7–10.2)
Chloride: 103 mmol/L (ref 96–106)
Creatinine, Ser: 0.76 mg/dL (ref 0.76–1.27)
Globulin, Total: 2.1 g/dL (ref 1.5–4.5)
Glucose: 89 mg/dL (ref 70–99)
Potassium: 4.2 mmol/L (ref 3.5–5.2)
Sodium: 142 mmol/L (ref 134–144)
Total Protein: 7 g/dL (ref 6.0–8.5)
eGFR: 118 mL/min/{1.73_m2} (ref >59)

## 2021-12-21 LAB — LIPID PANEL
Chol/HDL Ratio: 4.8 ratio (ref 0.0–5.0)
Cholesterol, Total: 237 mg/dL — ABNORMAL HIGH (ref 100–199)
HDL: 49 mg/dL (ref >39)
LDL Chol Calc (NIH): 120 mg/dL — ABNORMAL HIGH (ref 0–99)
Triglycerides: 390 mg/dL — ABNORMAL HIGH (ref 0–149)
VLDL Cholesterol Cal: 68 mg/dL — ABNORMAL HIGH (ref 5–40)

## 2021-12-21 LAB — HEMOGLOBIN A1C
Est. average glucose Bld gHb Est-mCnc: 137 mg/dL
Hgb A1c MFr Bld: 6.4 % — ABNORMAL HIGH (ref 4.8–5.6)

## 2021-12-24 ENCOUNTER — Other Ambulatory Visit: Payer: Self-pay

## 2021-12-24 ENCOUNTER — Other Ambulatory Visit: Payer: Self-pay | Admitting: Nurse Practitioner

## 2021-12-24 DIAGNOSIS — E782 Mixed hyperlipidemia: Secondary | ICD-10-CM

## 2021-12-24 MED ORDER — ATORVASTATIN CALCIUM 40 MG PO TABS
40.0000 mg | ORAL_TABLET | Freq: Every day | ORAL | 3 refills | Status: DC
  Filled 2021-12-24: qty 90, 90d supply, fill #0
  Filled 2022-03-17: qty 30, 30d supply, fill #1

## 2021-12-27 ENCOUNTER — Other Ambulatory Visit: Payer: Self-pay

## 2021-12-28 ENCOUNTER — Other Ambulatory Visit: Payer: Self-pay

## 2022-03-17 ENCOUNTER — Other Ambulatory Visit: Payer: Self-pay

## 2022-03-22 ENCOUNTER — Other Ambulatory Visit: Payer: Self-pay

## 2022-06-29 ENCOUNTER — Encounter: Payer: Self-pay | Admitting: Nurse Practitioner

## 2022-06-29 NOTE — Progress Notes (Signed)
I signed on for 5 minutes and patient did not log in

## 2022-08-10 ENCOUNTER — Ambulatory Visit: Payer: Self-pay | Attending: Nurse Practitioner | Admitting: Nurse Practitioner

## 2022-08-10 ENCOUNTER — Encounter: Payer: Self-pay | Admitting: Nurse Practitioner

## 2022-08-10 ENCOUNTER — Other Ambulatory Visit: Payer: Self-pay

## 2022-08-10 VITALS — BP 127/73 | HR 90 | Ht 69.0 in | Wt 267.0 lb

## 2022-08-10 DIAGNOSIS — J3089 Other allergic rhinitis: Secondary | ICD-10-CM

## 2022-08-10 DIAGNOSIS — Z0120 Encounter for dental examination and cleaning without abnormal findings: Secondary | ICD-10-CM

## 2022-08-10 DIAGNOSIS — E782 Mixed hyperlipidemia: Secondary | ICD-10-CM

## 2022-08-10 DIAGNOSIS — J4530 Mild persistent asthma, uncomplicated: Secondary | ICD-10-CM

## 2022-08-10 DIAGNOSIS — R7303 Prediabetes: Secondary | ICD-10-CM

## 2022-08-10 DIAGNOSIS — D72829 Elevated white blood cell count, unspecified: Secondary | ICD-10-CM

## 2022-08-10 LAB — POCT GLYCOSYLATED HEMOGLOBIN (HGB A1C): HbA1c, POC (controlled diabetic range): 6 % (ref 0.0–7.0)

## 2022-08-10 MED ORDER — AZELASTINE HCL 0.1 % NA SOLN
2.0000 | Freq: Two times a day (BID) | NASAL | 12 refills | Status: DC
  Filled 2022-08-10: qty 30, 50d supply, fill #0
  Filled 2023-01-27: qty 30, 50d supply, fill #1

## 2022-08-10 MED ORDER — CETIRIZINE HCL 10 MG PO TABS
ORAL_TABLET | Freq: Every day | ORAL | 1 refills | Status: DC
  Filled 2022-08-10: qty 90, 90d supply, fill #0
  Filled 2022-12-07: qty 90, 90d supply, fill #1

## 2022-08-10 MED ORDER — MONTELUKAST SODIUM 10 MG PO TABS
ORAL_TABLET | Freq: Every day | ORAL | 1 refills | Status: DC
  Filled 2022-08-10: qty 90, 90d supply, fill #0
  Filled 2022-12-07: qty 90, 90d supply, fill #1

## 2022-08-10 NOTE — Progress Notes (Signed)
Assessment & Plan:  Nicholas Sanford was seen today for hypertension and diabetes.  Diagnoses and all orders for this visit:  Prediabetes -     CMP14+EGFR -     POCT glycosylated hemoglobin (Hb A1C) Continue blood sugar control as discussed in office today, low carbohydrate diet, and regular physical exercise as tolerated, 150 minutes per week (30 min each day, 5 days per week, or 50 min 3 days per week).   Leukocytosis, unspecified type -     CBC with Differential  Hypercholesterolemia with hypertriglyceridemia -     Lipid panel INSTRUCTIONS: Work on a low fat, heart healthy diet and participate in regular aerobic exercise program by working out at least 150 minutes per week; 5 days a week-30 minutes per day. Avoid red meat/beef/steak,  fried foods. junk foods, sodas, sugary drinks, unhealthy snacking, alcohol and smoking.  Drink at least 80 oz of water per day and monitor your carbohydrate intake daily.    Mild persistent asthma without complication -     montelukast (SINGULAIR) 10 MG tablet; TAKE 1 TABLET (10 MG TOTAL) BY MOUTH AT BEDTIME. -     cetirizine (ZYRTEC) 10 MG tablet; TAKE 1 TABLET (10 MG TOTAL) BY MOUTH DAILY.  Encounter for dental examination -     Ambulatory referral to Dentistry  Environmental and seasonal allergies -     azelastine (ASTELIN) 0.1 % nasal spray; Place 2 sprays into both nostrils 2 (two) times daily. Use in each nostril as directed    Patient has been counseled on age-appropriate routine health concerns for screening and prevention. These are reviewed and up-to-date. Referrals have been placed accordingly. Immunizations are up-to-date or declined.    Subjective:   Chief Complaint  Patient presents with   Hypertension   Diabetes   HPI Nicholas Sanford 36 y.o. male presents to office today to follow up for prediabetes and with ear issue  VRI was used to communicate directly with patient for the entire encounter including providing detailed patient  instructions.    Ear Pain: Patient presents with right ear pain.  Symptoms include plugged sensation in the right ear, productive cough, shortness of breath, and nasal congestion.  Symptoms began several days ago and are unchanged since that time. Patient denies chills, fever, and sore throat. Ear history: 0 previous ear infections.  He does have a history of allergies as well   Prediabetes Well controlled with diet only.  LDL not at goal. It does not appear he is taking this as prescribed as the last dispense date was July for 90 days.  Lab Results  Component Value Date   HGBA1C 6.0 08/10/2022    Lab Results  Component Value Date   LDLCALC 82 08/10/2022    Requesting dental referral for exam and cleaning.  Review of Systems  Constitutional:  Negative for fever, malaise/fatigue and weight loss.  HENT:  Positive for congestion, ear pain and hearing loss. Negative for ear discharge, nosebleeds, sinus pain, sore throat and tinnitus.   Eyes: Negative.  Negative for blurred vision, double vision and photophobia.  Respiratory:  Positive for cough, sputum production and shortness of breath. Negative for hemoptysis, wheezing and stridor.   Cardiovascular: Negative.  Negative for chest pain, palpitations and leg swelling.  Gastrointestinal: Negative.  Negative for heartburn, nausea and vomiting.  Musculoskeletal: Negative.  Negative for myalgias.  Neurological: Negative.  Negative for dizziness, focal weakness, seizures and headaches.  Endo/Heme/Allergies:  Positive for environmental allergies.  Psychiatric/Behavioral: Negative.  Negative for  suicidal ideas.     Past Medical History:  Diagnosis Date   Allergy    sesonal    Asthma    as a child     Past Surgical History:  Procedure Laterality Date   APPENDECTOMY  2014   LAPAROSCOPIC APPENDECTOMY N/A 08/11/2013   Procedure: APPENDECTOMY LAPAROSCOPIC;  Surgeon: Shann Medal, MD;  Location: [facility];  Service: General;  Laterality: N/A;     Family History  Problem Relation Age of Onset   Asthma Mother     Social History Reviewed with no changes to be made today.   Outpatient Medications Prior to Visit  Medication Sig Dispense Refill   albuterol (PROVENTIL) (2.5 MG/3ML) 0.083% nebulizer solution Take 6 mLs (5 mg total) by nebulization every 4 (four) hours as needed for wheezing or shortness of breath. 75 mL 3   albuterol (VENTOLIN HFA) 108 (90 Base) MCG/ACT inhaler INHALE 2 PUFFS INTO THE LUNGS EVERY 6 HOURS AS NEEDED FOR WHEEZING OR SHORTNESS OF BREATH 18 g 0   atorvastatin (LIPITOR) 40 MG tablet Take 1 tablet (40 mg total) by mouth daily. 90 tablet 3   fluticasone (FLONASE) 50 MCG/ACT nasal spray Place 2 sprays into both nostrils daily. 16 g 1   fluticasone furoate-vilanterol (BREO ELLIPTA) 100-25 MCG/ACT AEPB Inhale 1 puff into the lungs daily. 60 each 2   cetirizine (ZYRTEC) 10 MG tablet TAKE 1 TABLET (10 MG TOTAL) BY MOUTH DAILY. 90 tablet 1   montelukast (SINGULAIR) 10 MG tablet TAKE 1 TABLET (10 MG TOTAL) BY MOUTH AT BEDTIME. 90 tablet 1   No facility-administered medications prior to visit.    Allergies  Allergen Reactions   Penicillins Itching       Objective:    BP 127/73   Pulse 90   Ht 5' 9" (1.753 m)   Wt 267 lb (121.1 kg)   SpO2 94%   BMI 39.43 kg/m  Wt Readings from Last 3 Encounters:  08/10/22 267 lb (121.1 kg)  12/20/21 265 lb (120.2 kg)  10/06/20 279 lb (126.6 kg)    Physical Exam Vitals and nursing note reviewed.  Constitutional:      Appearance: He is well-developed.  HENT:     Head: Normocephalic and atraumatic.  Cardiovascular:     Rate and Rhythm: Normal rate and regular rhythm.     Heart sounds: Normal heart sounds. No murmur heard.    No friction rub. No gallop.  Pulmonary:     Effort: Pulmonary effort is normal. No tachypnea or respiratory distress.     Breath sounds: Normal breath sounds. No decreased breath sounds, wheezing, rhonchi or rales.  Chest:     Chest wall:  No tenderness.  Abdominal:     General: Bowel sounds are normal.     Palpations: Abdomen is soft.  Musculoskeletal:        General: Normal range of motion.     Cervical back: Normal range of motion.  Skin:    General: Skin is warm and dry.  Neurological:     Mental Status: He is alert and oriented to person, place, and time.     Coordination: Coordination normal.  Psychiatric:        Behavior: Behavior normal. Behavior is cooperative.        Thought Content: Thought content normal.        Judgment: Judgment normal.          Patient has been counseled extensively about nutrition and exercise as well as  the importance of adherence with medications and regular follow-up. The patient was given clear instructions to go to ER or return to medical center if symptoms don't improve, worsen or new problems develop. The patient verbalized understanding.   Follow-up: Return in about 6 months (around 02/09/2023) for prediabetes.   Gildardo Pounds, FNP-BC [facility] [place], [place]   08/17/2022, 11:03 AM

## 2022-08-11 LAB — CBC WITH DIFFERENTIAL/PLATELET
Basophils Absolute: 0.1 10*3/uL (ref 0.0–0.2)
Basos: 0 %
EOS (ABSOLUTE): 0.1 10*3/uL (ref 0.0–0.4)
Eos: 1 %
Hematocrit: 44.8 % (ref 37.5–51.0)
Hemoglobin: 14.7 g/dL (ref 13.0–17.7)
Immature Grans (Abs): 0 10*3/uL (ref 0.0–0.1)
Immature Granulocytes: 0 %
Lymphocytes Absolute: 2.3 10*3/uL (ref 0.7–3.1)
Lymphs: 17 %
MCH: 27.2 pg (ref 26.6–33.0)
MCHC: 32.8 g/dL (ref 31.5–35.7)
MCV: 83 fL (ref 79–97)
Monocytes Absolute: 1.4 10*3/uL — ABNORMAL HIGH (ref 0.1–0.9)
Monocytes: 10 %
Neutrophils Absolute: 10 10*3/uL — ABNORMAL HIGH (ref 1.4–7.0)
Neutrophils: 72 %
Platelets: 247 10*3/uL (ref 150–450)
RBC: 5.4 x10E6/uL (ref 4.14–5.80)
RDW: 13.2 % (ref 11.6–15.4)
WBC: 14 10*3/uL — ABNORMAL HIGH (ref 3.4–10.8)

## 2022-08-11 LAB — CMP14+EGFR
ALT: 38 IU/L (ref 0–44)
AST: 19 IU/L (ref 0–40)
Albumin/Globulin Ratio: 3.2 — ABNORMAL HIGH (ref 1.2–2.2)
Albumin: 5.1 g/dL (ref 4.1–5.1)
Alkaline Phosphatase: 123 IU/L — ABNORMAL HIGH (ref 44–121)
BUN/Creatinine Ratio: 17 (ref 9–20)
BUN: 13 mg/dL (ref 6–20)
Bilirubin Total: 0.3 mg/dL (ref 0.0–1.2)
CO2: 26 mmol/L (ref 20–29)
Calcium: 8.9 mg/dL (ref 8.7–10.2)
Chloride: 102 mmol/L (ref 96–106)
Creatinine, Ser: 0.77 mg/dL (ref 0.76–1.27)
Globulin, Total: 1.6 g/dL (ref 1.5–4.5)
Glucose: 99 mg/dL (ref 70–99)
Potassium: 3.5 mmol/L (ref 3.5–5.2)
Sodium: 141 mmol/L (ref 134–144)
Total Protein: 6.7 g/dL (ref 6.0–8.5)
eGFR: 118 mL/min/{1.73_m2} (ref >59)

## 2022-08-11 LAB — LIPID PANEL
Chol/HDL Ratio: 3.1 ratio (ref 0.0–5.0)
Cholesterol, Total: 153 mg/dL (ref 100–199)
HDL: 50 mg/dL (ref >39)
LDL Chol Calc (NIH): 82 mg/dL (ref 0–99)
Triglycerides: 118 mg/dL (ref 0–149)
VLDL Cholesterol Cal: 21 mg/dL (ref 5–40)

## 2022-08-14 ENCOUNTER — Other Ambulatory Visit: Payer: Self-pay | Admitting: Nurse Practitioner

## 2022-08-14 DIAGNOSIS — D72829 Elevated white blood cell count, unspecified: Secondary | ICD-10-CM

## 2022-08-15 ENCOUNTER — Other Ambulatory Visit: Payer: Self-pay | Admitting: Nurse Practitioner

## 2022-08-15 ENCOUNTER — Telehealth: Payer: Self-pay

## 2022-08-15 DIAGNOSIS — J069 Acute upper respiratory infection, unspecified: Secondary | ICD-10-CM

## 2022-08-15 MED ORDER — PSEUDOEPH-BROMPHEN-DM 30-2-10 MG/5ML PO SYRP
5.0000 mL | ORAL_SOLUTION | Freq: Four times a day (QID) | ORAL | 0 refills | Status: DC | PRN
  Filled 2022-08-15: qty 240, 12d supply, fill #0

## 2022-08-15 MED ORDER — AZITHROMYCIN 250 MG PO TABS
ORAL_TABLET | ORAL | 0 refills | Status: AC
  Filled 2022-08-15: qty 6, 5d supply, fill #0

## 2022-08-15 NOTE — Telephone Encounter (Signed)
-----   Message from Gildardo Pounds, NP sent at 08/14/2022  9:16 AM EST ----- Kidney, liver function and electrolytes are essentially normal.    Cholesterol levels look much better. Continue on atorvastatin as prescribed.   White blood count significantly elevated. Not sure why. Is he having any symptoms of cold or flu? Needs to repeat in 2 weeks.

## 2022-08-15 NOTE — Telephone Encounter (Signed)
Antibiotic sent to pharmacy.  

## 2022-08-15 NOTE — Telephone Encounter (Signed)
Pt was called and is aware of results, DOB was confirmed. Appt scheduled   Interpreter id [phone]

## 2022-08-16 ENCOUNTER — Other Ambulatory Visit: Payer: Self-pay

## 2022-08-16 NOTE — Telephone Encounter (Signed)
Interpreter [place] ID# [phone]. Patient identified by name and date of birth. Patient aware of response.

## 2022-08-17 ENCOUNTER — Encounter: Payer: Self-pay | Admitting: Nurse Practitioner

## 2022-08-17 ENCOUNTER — Other Ambulatory Visit: Payer: Self-pay

## 2022-08-30 ENCOUNTER — Ambulatory Visit: Payer: Self-pay | Attending: Family Medicine

## 2022-08-30 DIAGNOSIS — D72829 Elevated white blood cell count, unspecified: Secondary | ICD-10-CM

## 2022-08-31 LAB — CBC WITH DIFFERENTIAL/PLATELET
Basophils Absolute: 0 10*3/uL (ref 0.0–0.2)
Basos: 1 %
EOS (ABSOLUTE): 0 10*3/uL (ref 0.0–0.4)
Eos: 0 %
Hematocrit: 43.6 % (ref 37.5–51.0)
Hemoglobin: 14.4 g/dL (ref 13.0–17.7)
Immature Grans (Abs): 0 10*3/uL (ref 0.0–0.1)
Immature Granulocytes: 0 %
Lymphocytes Absolute: 0.5 10*3/uL — ABNORMAL LOW (ref 0.7–3.1)
Lymphs: 9 %
MCH: 26.7 pg (ref 26.6–33.0)
MCHC: 33 g/dL (ref 31.5–35.7)
MCV: 81 fL (ref 79–97)
Monocytes Absolute: 0.2 10*3/uL (ref 0.1–0.9)
Monocytes: 4 %
Neutrophils Absolute: 4.3 10*3/uL (ref 1.4–7.0)
Neutrophils: 86 %
Platelets: 227 10*3/uL (ref 150–450)
RBC: 5.4 x10E6/uL (ref 4.14–5.80)
RDW: 12.3 % (ref 11.6–15.4)
WBC: 5 10*3/uL (ref 3.4–10.8)

## 2022-12-07 ENCOUNTER — Other Ambulatory Visit: Payer: Self-pay | Admitting: Family Medicine

## 2022-12-07 ENCOUNTER — Other Ambulatory Visit: Payer: Self-pay

## 2022-12-07 DIAGNOSIS — J4550 Severe persistent asthma, uncomplicated: Secondary | ICD-10-CM

## 2022-12-07 MED ORDER — ALBUTEROL SULFATE HFA 108 (90 BASE) MCG/ACT IN AERS
2.0000 | INHALATION_SPRAY | Freq: Four times a day (QID) | RESPIRATORY_TRACT | 2 refills | Status: DC | PRN
  Filled 2022-12-07: qty 18, 25d supply, fill #0
  Filled 2023-01-27: qty 18, 25d supply, fill #1

## 2022-12-08 ENCOUNTER — Other Ambulatory Visit: Payer: Self-pay

## 2022-12-09 ENCOUNTER — Other Ambulatory Visit: Payer: Self-pay | Admitting: Pharmacist

## 2022-12-09 NOTE — Progress Notes (Signed)
Patient seen by Coralyn Helling, PharmD Candidate on 12/08/2022 while they were picking up prescriptions at [facility] at [facility].   Blood pressure today was : 137/78, HR 76   Patient does not have an automated home blood pressure machine.   Medication review was performed. They are taking medications as prescribed. Patient is not currently on any BP meds.   The following barriers to adherence were noted:  - They do not have cost concerns.  - They do not have transportation concerns.  - They do not need assistance obtaining refills.  - They do not occasionally forget to take some of their prescribed medications.  - They do not feel like one/some of their medications make them feel poorly.  - They do not have questions or concerns about their medications.  - They do not have follow up scheduled with their primary care provider/cardiologist.   The following interventions were completed:  - Medications were reviewed  - Patient was educated on goal blood pressures and long term health implications of elevated blood pressure.  - Patient was counseled on lifestyle modifications to improve blood pressure, including decreasing salt intake and exercising.  - Patient was advised to keep an eye on BP and make appointment with PCP because today's BP indicated stage I HTN.   The patient does not have follow up scheduled.   Coralyn Helling, PharmD Candidate   Alvino Blood, PharmD, BCACP

## 2023-01-15 ENCOUNTER — Ambulatory Visit (INDEPENDENT_AMBULATORY_CARE_PROVIDER_SITE_OTHER): Payer: Self-pay

## 2023-01-15 ENCOUNTER — Encounter (HOSPITAL_COMMUNITY): Payer: Self-pay

## 2023-01-15 ENCOUNTER — Ambulatory Visit (HOSPITAL_COMMUNITY)
Admission: EM | Admit: 2023-01-15 | Discharge: 2023-01-15 | Disposition: A | Discharge status: Home / Self Care | Payer: Self-pay | Attending: Emergency Medicine | Admitting: Emergency Medicine

## 2023-01-15 DIAGNOSIS — J069 Acute upper respiratory infection, unspecified: Secondary | ICD-10-CM

## 2023-01-15 DIAGNOSIS — J4521 Mild intermittent asthma with (acute) exacerbation: Secondary | ICD-10-CM

## 2023-01-15 MED ORDER — PREDNISONE 20 MG PO TABS: 40.0000 mg | ORAL_TABLET | Freq: Every day | ORAL | 0 refills | Status: AC

## 2023-01-15 MED ORDER — ALBUTEROL SULFATE HFA 108 (90 BASE) MCG/ACT IN AERS
1.0000 | INHALATION_SPRAY | Freq: Once | RESPIRATORY_TRACT | Status: AC
  Administered 2023-01-15: 1 via RESPIRATORY_TRACT

## 2023-01-15 MED ORDER — DEXAMETHASONE 10 MG/ML FOR PEDIATRIC ORAL USE
INTRAMUSCULAR | Status: AC
  Filled 2023-01-15: qty 1

## 2023-01-15 MED ORDER — ALBUTEROL SULFATE HFA 108 (90 BASE) MCG/ACT IN AERS
INHALATION_SPRAY | RESPIRATORY_TRACT | Status: AC
  Filled 2023-01-15: qty 6.7

## 2023-01-15 MED ORDER — IPRATROPIUM-ALBUTEROL 0.5-2.5 (3) MG/3ML IN SOLN
RESPIRATORY_TRACT | Status: AC
  Filled 2023-01-15: qty 3

## 2023-01-15 MED ORDER — DEXAMETHASONE SODIUM PHOSPHATE 10 MG/ML IJ SOLN
10.0000 mg | Freq: Once | INTRAMUSCULAR | Status: AC
  Administered 2023-01-15: 10 mg via INTRAMUSCULAR

## 2023-01-15 MED ORDER — AZITHROMYCIN 250 MG PO TABS: ORAL_TABLET | ORAL | 0 refills | Status: DC

## 2023-01-15 MED ORDER — IPRATROPIUM-ALBUTEROL 0.5-2.5 (3) MG/3ML IN SOLN
3.0000 mL | Freq: Once | RESPIRATORY_TRACT | Status: AC
  Administered 2023-01-15: 3 mL via RESPIRATORY_TRACT

## 2023-01-15 NOTE — ED Provider Notes (Signed)
[facility]    CSN: [id number] Arrival date & time: 01/15/23  1123      History   Chief Complaint Chief Complaint  Patient presents with   Asthma    HPI Nicholas Sanford is a 37 y.o. male.   Patient presents to clinic reporting an asthma flareup for the past week as well as a cough.  He feels like he cannot take a full deep breath.  He is also requesting a refill of his albuterol inhaler, last use yesterday.  Reports his inhaler was helping.  He denies fevers.  Does endorse shortness of breath and wheezing.  Denies sore throat, abdominal pain, nausea, vomiting, diarrhea or recent sick contacts.    The history is provided by the patient and medical records.  Asthma Associated symptoms include shortness of breath. Pertinent negatives include no chest pain and no abdominal pain.    Past Medical History:  Diagnosis Date   Allergy    sesonal    Asthma    as a child     Patient Active Problem List   Diagnosis Date Noted   Severe obesity (BMI 35.0-39.9) with comorbidity (HCC) 11/12/2018   Obesity (BMI 30-39.9) 06/03/2015   Poor dentition 05/26/2015   Allergic 12/16/2011   Asthma, severe persistent, well-controlled 10/09/2011    Past Surgical History:  Procedure Laterality Date   APPENDECTOMY  2014   LAPAROSCOPIC APPENDECTOMY N/A 08/11/2013   Procedure: APPENDECTOMY LAPAROSCOPIC;  Surgeon: Kandis Cocking, MD;  Location: [facility];  Service: General;  Laterality: N/A;       Home Medications    Prior to Admission medications   Medication Sig Start Date End Date Taking? Authorizing Provider  albuterol (PROVENTIL) (2.5 MG/3ML) 0.083% nebulizer solution Take 6 mLs (5 mg total) by nebulization every 4 (four) hours as needed for wheezing or shortness of breath. 12/20/21  Yes Claiborne Rigg, NP  albuterol (VENTOLIN HFA) 108 (90 Base) MCG/ACT inhaler INHALE 2 PUFFS INTO THE LUNGS EVERY 6 HOURS AS NEEDED FOR WHEEZING OR SHORTNESS OF BREATH 12/07/22  Yes Hoy Register, MD  atorvastatin (LIPITOR) 40 MG tablet Take 1 tablet (40 mg total) by mouth daily. 12/24/21  Yes Claiborne Rigg, NP  azithromycin (ZITHROMAX Z-PAK) 250 MG tablet Take 2 tablets today and then one tablet per day for the next 5 days. 01/15/23  Yes Rinaldo Ratel, [place] N, FNP  cetirizine (ZYRTEC) 10 MG tablet TAKE 1 TABLET (10 MG TOTAL) BY MOUTH DAILY. 08/10/22 03/08/23 Yes Claiborne Rigg, NP  fluticasone (FLONASE) 50 MCG/ACT nasal spray Place 2 sprays into both nostrils daily. 12/20/21  Yes Claiborne Rigg, NP  fluticasone furoate-vilanterol (BREO ELLIPTA) 100-25 MCG/ACT AEPB Inhale 1 puff into the lungs daily. 12/20/21  Yes Newlin, Odette Horns, MD  montelukast (SINGULAIR) 10 MG tablet TAKE 1 TABLET (10 MG TOTAL) BY MOUTH AT BEDTIME. 08/10/22 03/08/23 Yes Claiborne Rigg, NP  predniSONE (DELTASONE) 20 MG tablet Take 2 tablets (40 mg total) by mouth daily for 5 days. 01/15/23 01/20/23 Yes Rinaldo Ratel, [place] N, FNP  azelastine (ASTELIN) 0.1 % nasal spray Place 2 sprays into both nostrils 2 (two) times daily. Use in each nostril as directed 08/10/22   Claiborne Rigg, NP  brompheniramine-pseudoephedrine-DM 30-2-10 MG/5ML syrup Take 5 mLs by mouth 4 (four) times daily as needed. 08/15/22   Claiborne Rigg, NP    Family History Family History  Problem Relation Age of Onset   Asthma Mother     Social History Social History  Tobacco Use   Smoking status: Never   Smokeless tobacco: Never  Vaping Use   Vaping Use: Never used  Substance Use Topics   Alcohol use: No    Alcohol/week: 0.0 standard drinks of alcohol   Drug use: No     Allergies   Penicillins   Review of Systems Review of Systems  Constitutional:  Negative for chills, fatigue and fever.  HENT:  Negative for congestion and sore throat.   Respiratory:  Positive for cough, shortness of breath and wheezing.   Cardiovascular:  Negative for chest pain.  Gastrointestinal:  Negative for abdominal pain, diarrhea, nausea and vomiting.   Genitourinary:  Negative for dysuria.     Physical Exam Triage Vital Signs ED Triage Vitals [01/15/23 1212]  Enc Vitals Group     BP 131/75     Pulse Rate 78     Resp 18     Temp 98 F (36.7 C)     Temp src      SpO2 93 %     Weight      Height      Head Circumference      Peak Flow      Pain Score      Pain Loc      Pain Edu?      Excl. in GC?    No data found.  Updated Vital Signs BP 131/75 (BP Location: Left Arm)   Pulse 78   Temp 98 F (36.7 C)   Resp 18   SpO2 93%   Visual Acuity Right Eye Distance:   Left Eye Distance:   Bilateral Distance:    Right Eye Near:   Left Eye Near:    Bilateral Near:     Physical Exam Vitals and nursing note reviewed.  Constitutional:      Appearance: Normal appearance.  HENT:     Head: Normocephalic and atraumatic.     Right Ear: External ear normal.     Left Ear: External ear normal.     Nose: Nose normal.     Mouth/Throat:     Mouth: Mucous membranes are moist.  Eyes:     Conjunctiva/sclera: Conjunctivae normal.  Cardiovascular:     Rate and Rhythm: Normal rate and regular rhythm.     Heart sounds: Normal heart sounds. No murmur heard. Pulmonary:     Effort: Pulmonary effort is normal.     Breath sounds: Wheezing and rhonchi present.  Musculoskeletal:        General: No swelling. Normal range of motion.  Skin:    General: Skin is warm and dry.  Neurological:     General: No focal deficit present.     Mental Status: He is alert and oriented to person, place, and time.  Psychiatric:        Mood and Affect: Mood normal.        Behavior: Behavior normal.      UC Treatments / Results  Labs (all labs ordered are listed, but only abnormal results are displayed) Labs Reviewed - No data to display  EKG   Radiology DG Chest 2 View  Result Date: 01/15/2023 CLINICAL DATA:  Shortness of breath and wheezing. EXAM: CHEST - 2 VIEW COMPARISON:  05/06/2015 FINDINGS: The heart size and mediastinal contours are  within normal limits. Both lungs are clear. The visualized skeletal structures are unremarkable. IMPRESSION: No active cardiopulmonary disease. Electronically Signed   By: Kennith Center M.D.   On: 01/15/2023 12:47  Procedures Procedures (including critical care time)  Medications Ordered in UC Medications  albuterol (VENTOLIN HFA) 108 (90 Base) MCG/ACT inhaler 1 puff (has no administration in time range)  dexamethasone (DECADRON) injection 10 mg (10 mg Intramuscular Given 01/15/23 1235)  ipratropium-albuterol (DUONEB) 0.5-2.5 (3) MG/3ML nebulizer solution 3 mL (3 mLs Nebulization Given 01/15/23 1235)    Initial Impression / Assessment and Plan / UC Course  I have reviewed the triage vital signs and the nursing notes.  Pertinent labs & imaging results that were available during my care of the patient were reviewed by me and considered in my medical decision making (see chart for details).  Vitals and triage reviewed, patient is hemodynamically stable.  Patient without acute respiratory distress, oxygenation 93% on room air.  Diffuse wheezing and rhonchi on exam. Chest x-ray for infiltrate or infection. DuoNeb, IM Decadron given in clinic, patient with subjective improvement in wheezing and shortness of breath.  Lung sounds improved in all fields.  Oxygenation improved to 96% room air. Albuterol inhaler given in clinic, steroid burst and Z-Pak for bacterial upper respiratory infection.  Plan of care, follow-up care, and return precautions discussed, no questions at this time.     Final Clinical Impressions(s) / UC Diagnoses   Final diagnoses:  Mild intermittent asthma with exacerbation  Acute URI     Discharge Instructions      I am covering you with antibiotics for your upper respiratory infection.  Please take all antibiotics as prescribed and until finished.  You can also take the steroids daily with breakfast to help with inflammation.  Use your albuterol inhaler every 6 hours as  needed for wheezing or shortness of breath.  Please follow-up with your primary care provider for further evaluation and management of your asthma.  You can return to clinic if you have any new or concerning symptoms.     ED Prescriptions     Medication Sig Dispense Auth. Provider   predniSONE (DELTASONE) 20 MG tablet Take 2 tablets (40 mg total) by mouth daily for 5 days. 10 tablet Rinaldo Ratel, [place] N, FNP   azithromycin (ZITHROMAX Z-PAK) 250 MG tablet Take 2 tablets today and then one tablet per day for the next 5 days. 6 tablet Marty Uy, [place] N, [place]      PDMP not reviewed this encounter.   Beda Dula, [place] N, [place] 01/15/23 1259

## 2023-01-15 NOTE — ED Triage Notes (Signed)
C/O asthma flare-up and coughing x 1 week. Pt needs a refill on Albuterol.

## 2023-01-15 NOTE — Discharge Instructions (Signed)
I am covering you with antibiotics for your upper respiratory infection.  Please take all antibiotics as prescribed and until finished.  You can also take the steroids daily with breakfast to help with inflammation.  Use your albuterol inhaler every 6 hours as needed for wheezing or shortness of breath.  Please follow-up with your primary care provider for further evaluation and management of your asthma.  You can return to clinic if you have any new or concerning symptoms.

## 2023-01-15 NOTE — ED Triage Notes (Signed)
Last use inhaler was yesterday.

## 2023-01-27 ENCOUNTER — Other Ambulatory Visit: Payer: Self-pay

## 2023-02-02 ENCOUNTER — Other Ambulatory Visit: Payer: Self-pay

## 2023-02-13 ENCOUNTER — Ambulatory Visit: Payer: Self-pay | Attending: Nurse Practitioner | Admitting: Nurse Practitioner

## 2023-02-13 ENCOUNTER — Other Ambulatory Visit: Payer: Self-pay

## 2023-02-13 ENCOUNTER — Encounter: Payer: Self-pay | Admitting: Nurse Practitioner

## 2023-02-13 VITALS — BP 130/72 | HR 79 | Resp 18 | Ht 69.0 in | Wt 273.0 lb

## 2023-02-13 DIAGNOSIS — E782 Mixed hyperlipidemia: Secondary | ICD-10-CM

## 2023-02-13 DIAGNOSIS — R7303 Prediabetes: Secondary | ICD-10-CM

## 2023-02-13 DIAGNOSIS — J4530 Mild persistent asthma, uncomplicated: Secondary | ICD-10-CM

## 2023-02-13 LAB — POCT GLYCOSYLATED HEMOGLOBIN (HGB A1C): HbA1c, POC (prediabetic range): 5.8 % (ref 5.7–6.4)

## 2023-02-13 MED ORDER — MONTELUKAST SODIUM 10 MG PO TABS
10.0000 mg | ORAL_TABLET | Freq: Every day | ORAL | 1 refills | Status: DC
Start: 2023-02-13 — End: 2023-08-15
  Filled 2023-02-13: qty 90, 90d supply, fill #0

## 2023-02-13 MED ORDER — FLUTICASONE FUROATE-VILANTEROL 100-25 MCG/ACT IN AEPB
1.0000 | INHALATION_SPRAY | Freq: Every day | RESPIRATORY_TRACT | 2 refills | Status: DC
  Filled 2023-02-13: qty 60, 30d supply, fill #0

## 2023-02-13 MED ORDER — CETIRIZINE HCL 10 MG PO TABS
10.0000 mg | ORAL_TABLET | Freq: Every day | ORAL | 1 refills | Status: DC
Start: 2023-02-13 — End: 2023-08-15
  Filled 2023-02-13: qty 90, 90d supply, fill #0

## 2023-02-13 MED ORDER — PREDNISONE 20 MG PO TABS
40.0000 mg | ORAL_TABLET | Freq: Every day | ORAL | 0 refills | Status: AC
Start: 2023-02-13 — End: 2023-02-19
  Filled 2023-02-13: qty 10, 5d supply, fill #0

## 2023-02-13 MED ORDER — FLUTICASONE PROPIONATE 50 MCG/ACT NA SUSP
2.0000 | Freq: Every day | NASAL | 1 refills | Status: DC
Start: 2023-02-13 — End: 2023-12-30
  Filled 2023-02-13: qty 16, 30d supply, fill #0
  Filled 2023-10-30 (×2): qty 16, 30d supply, fill #1

## 2023-02-13 MED ORDER — ATORVASTATIN CALCIUM 40 MG PO TABS
40.0000 mg | ORAL_TABLET | Freq: Every day | ORAL | 3 refills | Status: AC
Start: 2023-02-13 — End: ?
  Filled 2023-02-13: qty 90, 90d supply, fill #0
  Filled 2023-10-30: qty 90, 90d supply, fill #1
  Filled 2024-02-04 – 2024-02-05 (×2): qty 90, 90d supply, fill #2

## 2023-02-13 MED ORDER — ALBUTEROL SULFATE (2.5 MG/3ML) 0.083% IN NEBU
5.0000 mg | INHALATION_SOLUTION | RESPIRATORY_TRACT | 3 refills | Status: AC | PRN
Start: 2023-02-13 — End: ?
  Filled 2023-02-13: qty 90, 3d supply, fill #0
  Filled 2023-10-30: qty 90, 3d supply, fill #1
  Filled 2024-02-04 – 2024-02-05 (×2): qty 90, 3d supply, fill #2

## 2023-02-13 NOTE — Progress Notes (Signed)
Assessment & Plan:  Nicholas Sanford was seen today for prediabetes.  Diagnoses and all orders for this visit:  Prediabetes -     POCT glycosylated hemoglobin (Hb A1C) -     CMP14+EGFR  Mild persistent asthma without complication -     cetirizine (ZYRTEC) 10 MG tablet; TAKE 1 TABLET (10 MG TOTAL) BY MOUTH DAILY. -     fluticasone (FLONASE) 50 MCG/ACT nasal spray; Place 2 sprays into both nostrils daily. -     montelukast (SINGULAIR) 10 MG tablet; TAKE 1 TABLET (10 MG TOTAL) BY MOUTH AT BEDTIME. -     albuterol (PROVENTIL) (2.5 MG/3ML) 0.083% nebulizer solution; Take 6 mLs (5 mg total) by nebulization every 4 (four) hours as needed for wheezing or shortness of breath. -     predniSONE (DELTASONE) 20 MG tablet; Take 2 tablets (40 mg total) by mouth daily with breakfast for 5 days. FOR ASTHMA  Hypercholesterolemia with hypertriglyceridemia -     atorvastatin (LIPITOR) 40 MG tablet; Take 1 tablet (40 mg total) by mouth daily.  Other orders -     fluticasone furoate-vilanterol (BREO ELLIPTA) 100-25 MCG/ACT AEPB; Inhale 1 puff into the lungs daily.    Patient has been counseled on age-appropriate routine health concerns for screening and prevention. These are reviewed and up-to-date. Referrals have been placed accordingly. Immunizations are up-to-date or declined.    Subjective:   Chief Complaint  Patient presents with   Prediabetes   HPI Nicholas Sanford 37 y.o. male presents to office today for follow up to prediabetes.    VRI was used to communicate directly with patient for the entire encounter including providing detailed patient instructions.    Prediabetes A1c at goal and prediabetes is well controlled with diet only.  LDL not at goal. Lab Results  Component Value Date   HGBA1C 5.8 02/13/2023    Lab Results  Component Value Date   HGBA1C 6.0 08/10/2022    Lab Results  Component Value Date   LDLCALC 82 08/10/2022    Asthma not controlled. He has not been using his BREO  inhaler. States when he went to the pharmacy they told him they did not have any refills available. Also out of nebs. He is noticeably wheezing on exam today.    Review of Systems  Constitutional:  Negative for fever, malaise/fatigue and weight loss.  HENT: Negative.  Negative for nosebleeds.   Eyes: Negative.  Negative for blurred vision, double vision and photophobia.  Respiratory:  Positive for shortness of breath and wheezing. Negative for cough.   Cardiovascular: Negative.  Negative for chest pain, palpitations and leg swelling.  Gastrointestinal: Negative.  Negative for heartburn, nausea and vomiting.  Musculoskeletal: Negative.  Negative for myalgias.  Neurological: Negative.  Negative for dizziness, focal weakness, seizures and headaches.  Psychiatric/Behavioral: Negative.  Negative for suicidal ideas.     Past Medical History:  Diagnosis Date   Allergy    sesonal    Asthma    as a child     Past Surgical History:  Procedure Laterality Date   APPENDECTOMY  2014   LAPAROSCOPIC APPENDECTOMY N/A 08/11/2013   Procedure: APPENDECTOMY LAPAROSCOPIC;  Surgeon: Kandis Cocking, MD;  Location: [facility];  Service: General;  Laterality: N/A;    Family History  Problem Relation Age of Onset   Asthma Mother     Social History Reviewed with no changes to be made today.   Outpatient Medications Prior to Visit  Medication Sig Dispense Refill  albuterol (VENTOLIN HFA) 108 (90 Base) MCG/ACT inhaler INHALE 2 PUFFS INTO THE LUNGS EVERY 6 HOURS AS NEEDED FOR WHEEZING OR SHORTNESS OF BREATH 18 g 2   azelastine (ASTELIN) 0.1 % nasal spray Place 2 sprays into both nostrils 2 (two) times daily. Use in each nostril as directed 30 mL 12   albuterol (PROVENTIL) (2.5 MG/3ML) 0.083% nebulizer solution Take 6 mLs (5 mg total) by nebulization every 4 (four) hours as needed for wheezing or shortness of breath. 75 mL 3   atorvastatin (LIPITOR) 40 MG tablet Take 1 tablet (40 mg total) by mouth daily. 90  tablet 3   azithromycin (ZITHROMAX Z-PAK) 250 MG tablet Take 2 tablets today and then one tablet per day for the next 5 days. 6 tablet 0   brompheniramine-pseudoephedrine-DM 30-2-10 MG/5ML syrup Take 5 mLs by mouth 4 (four) times daily as needed. 240 mL 0   cetirizine (ZYRTEC) 10 MG tablet TAKE 1 TABLET (10 MG TOTAL) BY MOUTH DAILY. 90 tablet 1   fluticasone (FLONASE) 50 MCG/ACT nasal spray Place 2 sprays into both nostrils daily. 16 g 1   fluticasone furoate-vilanterol (BREO ELLIPTA) 100-25 MCG/ACT AEPB Inhale 1 puff into the lungs daily. 60 each 2   montelukast (SINGULAIR) 10 MG tablet TAKE 1 TABLET (10 MG TOTAL) BY MOUTH AT BEDTIME. 90 tablet 1   No facility-administered medications prior to visit.    Allergies  Allergen Reactions   Penicillins Itching       Objective:    BP 130/72 (BP Location: Left Arm, Patient Position: Sitting, Cuff Size: Large)   Pulse 79   Resp 18   Ht 5\' 9"  (1.753 m)   Wt 273 lb (123.8 kg)   SpO2 93%   BMI 40.32 kg/m  Wt Readings from Last 3 Encounters:  02/13/23 273 lb (123.8 kg)  08/10/22 267 lb (121.1 kg)  12/20/21 265 lb (120.2 kg)    Physical Exam Vitals and nursing note reviewed.  Constitutional:      Appearance: He is well-developed.  HENT:     Head: Normocephalic and atraumatic.  Cardiovascular:     Rate and Rhythm: Normal rate and regular rhythm.     Heart sounds: Normal heart sounds. No murmur heard.    No friction rub. No gallop.  Pulmonary:     Effort: Pulmonary effort is normal. No tachypnea or respiratory distress.     Breath sounds: Normal breath sounds. No decreased breath sounds, wheezing, rhonchi or rales.  Chest:     Chest wall: No tenderness.  Abdominal:     General: Bowel sounds are normal.     Palpations: Abdomen is soft.  Musculoskeletal:        General: Normal range of motion.     Cervical back: Normal range of motion.  Skin:    General: Skin is warm and dry.  Neurological:     Mental Status: He is alert and  oriented to person, place, and time.     Coordination: Coordination normal.  Psychiatric:        Behavior: Behavior normal. Behavior is cooperative.        Thought Content: Thought content normal.        Judgment: Judgment normal.          Patient has been counseled extensively about nutrition and exercise as well as the importance of adherence with medications and regular follow-up. The patient was given clear instructions to go to ER or return to medical center if symptoms don't improve,  worsen or new problems develop. The patient verbalized understanding.   Follow-up: Return in about 6 months (around 08/15/2023).   Claiborne Rigg, FNP-BC [facility] [place], [place] [phone]   02/13/2023, 3:43 PM

## 2023-02-14 ENCOUNTER — Other Ambulatory Visit: Payer: Self-pay

## 2023-02-14 LAB — CMP14+EGFR
ALT: 39 IU/L (ref 0–44)
AST: 20 IU/L (ref 0–40)
Albumin/Globulin Ratio: 2.5
Albumin: 4.8 g/dL (ref 4.1–5.1)
Alkaline Phosphatase: 109 IU/L (ref 44–121)
BUN/Creatinine Ratio: 16 (ref 9–20)
BUN: 15 mg/dL (ref 6–20)
Bilirubin Total: 0.2 mg/dL (ref 0.0–1.2)
CO2: 24 mmol/L (ref 20–29)
Calcium: 9.2 mg/dL (ref 8.7–10.2)
Chloride: 101 mmol/L (ref 96–106)
Creatinine, Ser: 0.92 mg/dL (ref 0.76–1.27)
Globulin, Total: 1.9 g/dL (ref 1.5–4.5)
Glucose: 97 mg/dL (ref 70–99)
Potassium: 3.9 mmol/L (ref 3.5–5.2)
Sodium: 140 mmol/L (ref 134–144)
Total Protein: 6.7 g/dL (ref 6.0–8.5)
eGFR: 109 mL/min/{1.73_m2} (ref >59)

## 2023-02-20 ENCOUNTER — Other Ambulatory Visit: Payer: Self-pay

## 2023-02-27 ENCOUNTER — Other Ambulatory Visit: Payer: Self-pay

## 2023-03-14 ENCOUNTER — Other Ambulatory Visit: Payer: Self-pay

## 2023-06-08 ENCOUNTER — Other Ambulatory Visit: Payer: Self-pay

## 2023-08-15 ENCOUNTER — Encounter: Payer: Self-pay | Admitting: Nurse Practitioner

## 2023-08-15 ENCOUNTER — Ambulatory Visit: Payer: Self-pay | Attending: Nurse Practitioner | Admitting: Nurse Practitioner

## 2023-08-15 ENCOUNTER — Other Ambulatory Visit: Payer: Self-pay

## 2023-08-15 VITALS — BP 121/74 | HR 67 | Ht 69.0 in | Wt 269.2 lb

## 2023-08-15 DIAGNOSIS — J3089 Other allergic rhinitis: Secondary | ICD-10-CM

## 2023-08-15 DIAGNOSIS — J4550 Severe persistent asthma, uncomplicated: Secondary | ICD-10-CM

## 2023-08-15 DIAGNOSIS — R7303 Prediabetes: Secondary | ICD-10-CM

## 2023-08-15 DIAGNOSIS — Z23 Encounter for immunization: Secondary | ICD-10-CM

## 2023-08-15 LAB — POCT GLYCOSYLATED HEMOGLOBIN (HGB A1C): HbA1c, POC (controlled diabetic range): 5.8 % (ref 0.0–7.0)

## 2023-08-15 MED ORDER — AZELASTINE HCL 0.1 % NA SOLN
2.0000 | Freq: Two times a day (BID) | NASAL | Status: DC
Start: 2023-08-15 — End: 2023-10-30

## 2023-08-15 MED ORDER — MONTELUKAST SODIUM 10 MG PO TABS
10.0000 mg | ORAL_TABLET | Freq: Every day | ORAL | 1 refills | Status: DC
Start: 2023-08-15 — End: 2024-02-04
  Filled 2023-08-15: qty 90, 90d supply, fill #0
  Filled 2023-10-30: qty 90, 90d supply, fill #1

## 2023-08-15 MED ORDER — PREDNISONE 20 MG PO TABS
20.0000 mg | ORAL_TABLET | Freq: Every day | ORAL | 0 refills | Status: AC
Start: 2023-08-15 — End: 2023-08-20
  Filled 2023-08-15: qty 5, 5d supply, fill #0

## 2023-08-15 MED ORDER — CETIRIZINE HCL 10 MG PO TABS
10.0000 mg | ORAL_TABLET | Freq: Every day | ORAL | 1 refills | Status: DC
Start: 2023-08-15 — End: 2024-02-04
  Filled 2023-08-15: qty 90, 90d supply, fill #0
  Filled 2023-10-30 (×2): qty 90, 90d supply, fill #1

## 2023-08-15 MED ORDER — ALBUTEROL SULFATE HFA 108 (90 BASE) MCG/ACT IN AERS
2.0000 | INHALATION_SPRAY | Freq: Four times a day (QID) | RESPIRATORY_TRACT | 2 refills | Status: DC | PRN
Start: 2023-08-15 — End: 2024-07-31
  Filled 2023-08-15: qty 6.7, 25d supply, fill #0
  Filled 2023-10-30: qty 6.7, 25d supply, fill #1
  Filled 2024-02-04 – 2024-02-05 (×2): qty 6.7, 25d supply, fill #2

## 2023-08-15 NOTE — Progress Notes (Signed)
Assessment & Plan:  Nicholas Sanford was seen today for medical management of chronic issues.  Diagnoses and all orders for this visit:  Prediabetes -     POCT glycosylated hemoglobin (Hb A1C) -     Basic metabolic panel  Asthma, severe persistent, well-controlled -     albuterol (VENTOLIN HFA) 108 (90 Base) MCG/ACT inhaler; INHALE 2 PUFFS INTO THE LUNGS EVERY 6 HOURS AS NEEDED FOR WHEEZING OR SHORTNESS OF BREATH -     predniSONE (DELTASONE) 20 MG tablet; Take 1 tablet (20 mg total) by mouth daily with breakfast for 5 days. -     montelukast (SINGULAIR) 10 MG tablet; Take 1 tablet (10 mg total) by mouth at bedtime. Take at bedtime for asthma symptoms  Environmental and seasonal allergies -     cetirizine (ZYRTEC) 10 MG tablet; Take 1 tablet (10 mg total) by mouth daily. -     azelastine (ASTELIN) 0.1 % nasal spray; Place 2 sprays into both nostrils 2 (two) times daily. Use in each nostril as directed    Patient has been counseled on age-appropriate routine health concerns for screening and prevention. These are reviewed and up-to-date. Referrals have been placed accordingly. Immunizations are up-to-date or declined.    Subjective:   Chief Complaint  Patient presents with   Medical Management of Chronic Issues    Nicholas Sanford 37 y.o. male presents to office today for follow up to prediabetes.   VRI was used to communicate directly with patient for the entire encounter including providing detailed patient instructions.    Notes mild asthma flare with symptoms including shortness of breath and wheezing. 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    Prediabetes A1c at goal and prediabetes is well controlled along with blood pressure. He does not take any medications for his diabetes. LDL not quite at goal.  Overall he denies any symptoms of hypo or hyperglycemia Lab Results  Component Value Date   HGBA1C 5.8 08/15/2023    Lab Results  Component Value Date    HGBA1C 5.8 02/13/2023    BP Readings from Last 3 Encounters:  08/15/23 121/74  02/13/23 130/72  01/15/23 131/75    Lab Results  Component Value Date   LDLCALC 82 08/10/2022    Review of Systems  Constitutional:  Negative for fever, malaise/fatigue and weight loss.  HENT: Negative.  Negative for nosebleeds.   Eyes: Negative.  Negative for blurred vision, double vision and photophobia.  Respiratory:  Positive for shortness of breath and wheezing. Negative for cough.   Cardiovascular: Negative.  Negative for chest pain, palpitations and leg swelling.  Gastrointestinal: Negative.  Negative for heartburn, nausea and vomiting.  Musculoskeletal: Negative.  Negative for myalgias.  Neurological: Negative.  Negative for dizziness, focal weakness, seizures and headaches.  Psychiatric/Behavioral: Negative.  Negative for suicidal ideas.     Past Medical History:  Diagnosis Date   Allergy    sesonal    Asthma    as a child     Past Surgical History:  Procedure Laterality Date   APPENDECTOMY  2014   LAPAROSCOPIC APPENDECTOMY N/A 08/11/2013   Procedure: APPENDECTOMY LAPAROSCOPIC;  Surgeon: Kandis Cocking, MD;  Location: [facility];  Service: General;  Laterality: N/A;    Family History  Problem Relation Age of Onset   Asthma Mother     Social History Reviewed with no changes to be made today.   Outpatient Medications Prior to Visit  Medication Sig Dispense  Refill   albuterol (PROVENTIL) (2.5 MG/3ML) 0.083% nebulizer solution Take 6 mLs (5 mg total) by nebulization every 4 (four) hours as needed for wheezing or shortness of breath. 90 mL 3   atorvastatin (LIPITOR) 40 MG tablet Take 1 tablet (40 mg total) by mouth daily. 90 tablet 3   fluticasone (FLONASE) 50 MCG/ACT nasal spray Place 2 sprays into both nostrils daily. 16 g 1   albuterol (VENTOLIN HFA) 108 (90 Base) MCG/ACT inhaler INHALE 2 PUFFS INTO THE LUNGS EVERY 6 HOURS AS NEEDED FOR WHEEZING OR SHORTNESS OF BREATH 18 g 2    cetirizine (ZYRTEC) 10 MG tablet Take 1 tablet (10 mg total) by mouth daily. 90 tablet 1   azelastine (ASTELIN) 0.1 % nasal spray Place 2 sprays into both nostrils 2 (two) times daily. Use in each nostril as directed (Patient not taking: Reported on 08/15/2023) 30 mL 12   fluticasone furoate-vilanterol (BREO ELLIPTA) 100-25 MCG/ACT AEPB Inhale 1 puff into the lungs daily. (Patient not taking: Reported on 08/15/2023) 60 each 2   montelukast (SINGULAIR) 10 MG tablet Take 1 tablet (10 mg total) by mouth at bedtime. (Patient not taking: Reported on 08/15/2023) 90 tablet 1   No facility-administered medications prior to visit.    Allergies  Allergen Reactions   Penicillins Itching       Objective:    BP 121/74 (BP Location: Left Arm, Patient Position: Sitting, Cuff Size: Large)   Pulse 67   Ht 5\' 9"  (1.753 m)   Wt 269 lb 3.2 oz (122.1 kg)   SpO2 96%   BMI 39.75 kg/m  Wt Readings from Last 3 Encounters:  08/15/23 269 lb 3.2 oz (122.1 kg)  02/13/23 273 lb (123.8 kg)  08/10/22 267 lb (121.1 kg)    Physical Exam Vitals and nursing note reviewed.  Constitutional:      Appearance: He is well-developed.  HENT:     Head: Normocephalic and atraumatic.  Cardiovascular:     Rate and Rhythm: Normal rate and regular rhythm.     Heart sounds: Normal heart sounds. No murmur heard.    No friction rub. No gallop.  Pulmonary:     Effort: Pulmonary effort is normal. No tachypnea or respiratory distress.     Breath sounds: Wheezing present. No decreased breath sounds, rhonchi or rales.  Chest:     Chest wall: No tenderness.  Abdominal:     General: Bowel sounds are normal.     Palpations: Abdomen is soft.  Musculoskeletal:        General: Normal range of motion.     Cervical back: Normal range of motion.  Skin:    General: Skin is warm and dry.  Neurological:     Mental Status: He is alert and oriented to person, place, and time.     Coordination: Coordination normal.  Psychiatric:         Behavior: Behavior normal. Behavior is cooperative.        Thought Content: Thought content normal.        Judgment: Judgment normal.          Patient has been counseled extensively about nutrition and exercise as well as the importance of adherence with medications and regular follow-up. The patient was given clear instructions to go to ER or return to medical center if symptoms don't improve, worsen or new problems develop. The patient verbalized understanding.   Follow-up: Return in about 6 months (around 02/13/2024).   Claiborne Rigg, FNP-BC [place]  [facility] [place], [place] [phone]   08/15/2023, 2:47 PM

## 2023-08-16 DIAGNOSIS — Z23 Encounter for immunization: Secondary | ICD-10-CM

## 2023-08-16 LAB — BASIC METABOLIC PANEL
BUN/Creatinine Ratio: 18 (ref 9–20)
BUN: 13 mg/dL (ref 6–20)
CO2: 27 mmol/L (ref 20–29)
Calcium: 9.6 mg/dL (ref 8.7–10.2)
Chloride: 102 mmol/L (ref 96–106)
Creatinine, Ser: 0.71 mg/dL — ABNORMAL LOW (ref 0.76–1.27)
Glucose: 84 mg/dL (ref 70–99)
Potassium: 4.2 mmol/L (ref 3.5–5.2)
Sodium: 143 mmol/L (ref 134–144)
eGFR: 120 mL/min/{1.73_m2} (ref >59)

## 2023-08-28 ENCOUNTER — Other Ambulatory Visit: Payer: Self-pay

## 2023-10-30 ENCOUNTER — Other Ambulatory Visit: Payer: Self-pay

## 2023-10-30 ENCOUNTER — Other Ambulatory Visit: Payer: Self-pay | Admitting: Nurse Practitioner

## 2023-10-30 DIAGNOSIS — J3089 Other allergic rhinitis: Secondary | ICD-10-CM

## 2023-10-30 MED ORDER — AZELASTINE HCL 0.1 % NA SOLN
2.0000 | Freq: Two times a day (BID) | NASAL | 0 refills | Status: AC
Start: 2023-10-30 — End: ?

## 2023-10-31 ENCOUNTER — Other Ambulatory Visit: Payer: Self-pay

## 2023-12-30 ENCOUNTER — Other Ambulatory Visit: Payer: Self-pay | Admitting: Nurse Practitioner

## 2023-12-30 DIAGNOSIS — J4530 Mild persistent asthma, uncomplicated: Secondary | ICD-10-CM

## 2024-01-01 ENCOUNTER — Other Ambulatory Visit: Payer: Self-pay

## 2024-01-01 MED ORDER — FLUTICASONE PROPIONATE 50 MCG/ACT NA SUSP
2.0000 | Freq: Every day | NASAL | 1 refills | Status: DC
Start: 2024-01-01 — End: 2024-07-31
  Filled 2024-01-01: qty 16, 30d supply, fill #0
  Filled 2024-02-04 – 2024-02-05 (×2): qty 16, 30d supply, fill #1

## 2024-01-01 NOTE — Telephone Encounter (Signed)
 Requested Prescriptions  Pending Prescriptions Disp Refills   fluticasone  (FLONASE ) 50 MCG/ACT nasal spray 16 g 1    Sig: Place 2 sprays into both nostrils daily.     Ear, Nose, and Throat: Nasal Preparations - Corticosteroids Passed - 01/01/2024 11:19 AM      Passed - Valid encounter within last 12 months    Recent Outpatient Visits           4 months ago Prediabetes   [facility] Of [place]. [facility] Collins Dean, NP   10 months ago Prediabetes   [facility] - [facility] Dept Of [place]. [facility] Collins Dean, NP   1 year ago Prediabetes   [facility] [place] - A Dept Of [place]. [facility] Collins Dean, NP   2 years ago Encounter for annual physical exam   [facility] [facility] Of [place]. [facility] Collins Dean, NP   2 years ago Mild persistent asthma without complication   [facility] Dept Of [place]. [facility] Collins Dean, [place]

## 2024-01-02 ENCOUNTER — Other Ambulatory Visit: Payer: Self-pay

## 2024-02-04 ENCOUNTER — Other Ambulatory Visit: Payer: Self-pay | Admitting: Nurse Practitioner

## 2024-02-04 DIAGNOSIS — J3089 Other allergic rhinitis: Secondary | ICD-10-CM

## 2024-02-04 DIAGNOSIS — J4550 Severe persistent asthma, uncomplicated: Secondary | ICD-10-CM

## 2024-02-05 ENCOUNTER — Other Ambulatory Visit: Payer: Self-pay

## 2024-02-06 ENCOUNTER — Other Ambulatory Visit: Payer: Self-pay

## 2024-02-06 MED ORDER — CETIRIZINE HCL 10 MG PO TABS
10.0000 mg | ORAL_TABLET | Freq: Every day | ORAL | 0 refills | Status: DC
Start: 2024-02-06 — End: 2024-07-31
  Filled 2024-02-06: qty 30, 30d supply, fill #0

## 2024-02-06 MED ORDER — MONTELUKAST SODIUM 10 MG PO TABS
10.0000 mg | ORAL_TABLET | Freq: Every day | ORAL | 0 refills | Status: DC
Start: 2024-02-06 — End: 2024-07-31
  Filled 2024-02-06: qty 30, 30d supply, fill #0

## 2024-03-05 ENCOUNTER — Other Ambulatory Visit: Payer: Self-pay

## 2024-03-05 ENCOUNTER — Other Ambulatory Visit: Payer: Self-pay | Admitting: Nurse Practitioner

## 2024-03-05 DIAGNOSIS — J4550 Severe persistent asthma, uncomplicated: Secondary | ICD-10-CM

## 2024-03-05 DIAGNOSIS — J3089 Other allergic rhinitis: Secondary | ICD-10-CM

## 2024-03-13 ENCOUNTER — Other Ambulatory Visit: Payer: Self-pay

## 2024-05-28 ENCOUNTER — Other Ambulatory Visit: Payer: Self-pay

## 2024-05-28 ENCOUNTER — Other Ambulatory Visit: Payer: Self-pay | Admitting: Nurse Practitioner

## 2024-05-30 ENCOUNTER — Other Ambulatory Visit: Payer: Self-pay

## 2024-06-18 ENCOUNTER — Telehealth: Payer: Self-pay | Admitting: Nurse Practitioner

## 2024-06-18 NOTE — Telephone Encounter (Unsigned)
 Copied from CRM [id number]. Topic: Clinical - Medication Refill >> Jun 18, 2024  3:43 PM Tobias L wrote: Medication: breoellipta Patient states the patient assistance expired and is requesting refill through the patient assistance. Patient scheduled follow up visit for 07/31/2024.   Has the patient contacted their pharmacy? No  This is the patient's preferred pharmacy:  [facility] [address] [place] [address] Phone: [phone] Fax: [phone]  Is this the correct pharmacy for this prescription? Yes  Has the prescription been filled recently? No  Is the patient out of the medication? Yes  Has the patient been seen for an appointment in the last year OR does the patient have an upcoming appointment? Yes  Can we respond through MyChart? No  Agent: Please be advised that Rx refills may take up to 3 business days. We ask that you follow-up with your pharmacy.

## 2024-06-18 NOTE — Telephone Encounter (Signed)
 Patient requesting new order for Breoellipta, not on profile.       Copied from CRM [id number]. Topic: Clinical - Medication Refill >> Jun 18, 2024  3:43 PM Tobias L wrote: Medication: breoellipta Patient states the patient assistance expired and is requesting refill through the patient assistance. Patient scheduled follow up visit for 07/31/2024.

## 2024-06-19 ENCOUNTER — Other Ambulatory Visit: Payer: Self-pay

## 2024-06-19 ENCOUNTER — Other Ambulatory Visit: Payer: Self-pay | Admitting: Nurse Practitioner

## 2024-06-19 MED ORDER — FLUTICASONE FUROATE-VILANTEROL 100-25 MCG/ACT IN AEPB
1.0000 | INHALATION_SPRAY | Freq: Every day | RESPIRATORY_TRACT | 0 refills | Status: DC
  Filled 2024-06-19: qty 60, 60d supply, fill #0

## 2024-06-20 NOTE — Telephone Encounter (Signed)
 Noted, thank you!

## 2024-06-21 ENCOUNTER — Other Ambulatory Visit: Payer: Self-pay

## 2024-06-24 ENCOUNTER — Other Ambulatory Visit: Payer: Self-pay

## 2024-07-08 ENCOUNTER — Other Ambulatory Visit: Payer: Self-pay

## 2024-07-31 ENCOUNTER — Encounter: Payer: Self-pay | Admitting: Nurse Practitioner

## 2024-07-31 ENCOUNTER — Other Ambulatory Visit: Payer: Self-pay

## 2024-07-31 ENCOUNTER — Ambulatory Visit: Payer: Self-pay | Attending: Nurse Practitioner | Admitting: Nurse Practitioner

## 2024-07-31 VITALS — BP 133/76 | HR 76 | Resp 19 | Ht 69.0 in | Wt 278.2 lb

## 2024-07-31 DIAGNOSIS — J3089 Other allergic rhinitis: Secondary | ICD-10-CM

## 2024-07-31 DIAGNOSIS — R7303 Prediabetes: Secondary | ICD-10-CM

## 2024-07-31 DIAGNOSIS — D72829 Elevated white blood cell count, unspecified: Secondary | ICD-10-CM

## 2024-07-31 DIAGNOSIS — J4530 Mild persistent asthma, uncomplicated: Secondary | ICD-10-CM

## 2024-07-31 DIAGNOSIS — Z23 Encounter for immunization: Secondary | ICD-10-CM

## 2024-07-31 MED ORDER — CETIRIZINE HCL 10 MG PO TABS
10.0000 mg | ORAL_TABLET | Freq: Every day | ORAL | 1 refills | Status: AC
Start: 2024-07-31 — End: ?
  Filled 2024-07-31: qty 90, 90d supply, fill #0

## 2024-07-31 MED ORDER — MONTELUKAST SODIUM 10 MG PO TABS
10.0000 mg | ORAL_TABLET | Freq: Every day | ORAL | 1 refills | Status: AC
Start: 2024-07-31 — End: ?
  Filled 2024-07-31: qty 90, 90d supply, fill #0

## 2024-07-31 MED ORDER — FLUTICASONE PROPIONATE 50 MCG/ACT NA SUSP
2.0000 | Freq: Every day | NASAL | 1 refills | Status: AC
Start: 2024-07-31 — End: ?
  Filled 2024-07-31: qty 16, 30d supply, fill #0

## 2024-07-31 MED ORDER — PREDNISONE 20 MG PO TABS
40.0000 mg | ORAL_TABLET | Freq: Every day | ORAL | 0 refills | Status: AC
Start: 2024-07-31 — End: 2024-08-05
  Filled 2024-07-31: qty 10, 5d supply, fill #0

## 2024-07-31 MED ORDER — FLUTICASONE FUROATE-VILANTEROL 100-25 MCG/ACT IN AEPB
1.0000 | INHALATION_SPRAY | Freq: Every day | RESPIRATORY_TRACT | 1 refills | Status: AC
  Filled 2024-07-31: qty 60, 30d supply, fill #0
  Filled 2024-09-17: qty 60, 30d supply, fill #1

## 2024-07-31 MED ORDER — ALBUTEROL SULFATE HFA 108 (90 BASE) MCG/ACT IN AERS
2.0000 | INHALATION_SPRAY | Freq: Four times a day (QID) | RESPIRATORY_TRACT | 2 refills | Status: AC | PRN
Start: 2024-07-31 — End: ?
  Filled 2024-07-31: qty 6.7, 25d supply, fill #0

## 2024-07-31 NOTE — Progress Notes (Signed)
 Assessment & Plan:  [name] was seen today for asthma and fatigue.  Diagnoses and all orders for this visit:  Mild persistent asthma without complication -     albuterol  (VENTOLIN  HFA) 108 (90 Base) MCG/ACT inhaler; Inhale 2 puffs into the lungs every 6 (six) hours as needed for wheezing or shortness of breath. -     fluticasone  (FLONASE ) 50 MCG/ACT nasal spray; Place 2 sprays into both nostrils daily. -     fluticasone  furoate-vilanterol (BREO ELLIPTA ) 100-25 MCG/ACT AEPB; Inhale 1 puff into the lungs daily. -     montelukast  (SINGULAIR ) 10 MG tablet; Take 1 tablet (10 mg total) by mouth at bedtime. -     predniSONE  (DELTASONE ) 20 MG tablet; Take 2 tablets (40 mg total) by mouth daily with breakfast for 5 days. FOR ASTHMA/WHEEZING Wheezing and difficulty breathing due to incorrect inhaler use. - Prescribed Breo inhaler for daily use. - Prescribed prednisone  for short-term use to reduce inflammation. - Instructed to take prednisone  early in the morning to avoid sleep disturbances. - Advised to return if throat symptoms do not improve for further evaluation.   Need for influenza vaccination -     Flu vaccine trivalent PF, 6mos and older(Flulaval,Afluria,Fluarix,Fluzone)  Environmental and seasonal allergies -     cetirizine  (ZYRTEC ) 10 MG tablet; Take 1 tablet (10 mg total) by mouth daily. Intermittent throat discomfort and occasional pain when swallowing suggestive of inflammation. - Refilled nasal spray prescription.  Leukocytosis, unspecified type -     CBC with Differential  Prediabetes -     CMP14+EGFR -     Hemoglobin A1c    Patient has been counseled on age-appropriate routine health concerns for screening and prevention. These are reviewed and up-to-date. Referrals have been placed accordingly. Immunizations are up-to-date or declined.    Subjective:   Chief Complaint  Patient presents with   Asthma   Fatigue   History of Present Illness Nicholas Sanford is a 38 year old male with asthma who presents for medication refills and respiratory symptoms.  He has been experiencing difficulty breathing, with discomfort in his neck and chest, causing agitation. These symptoms have persisted for the past few months and have worsened over the last couple of weeks. He experiences wheezing and has been using his rescue inhaler daily as he has been out of his BREO.  Associated symptoms include fatigue.   He describes a sensation of inflammation in his tonsils and experiences pain and occasional burning when swallowing, particularly saliva. He confirms occasional pain and burning when swallowing.     Review of Systems  Constitutional:  Positive for malaise/fatigue. Negative for fever and weight loss.  HENT:  Positive for congestion and sore throat. Negative for nosebleeds.   Eyes: Negative.  Negative for blurred vision, double vision and photophobia.  Respiratory:  Positive for shortness of breath and wheezing. Negative for cough.   Cardiovascular: Negative.  Negative for chest pain, palpitations and leg swelling.  Gastrointestinal: Negative.  Negative for heartburn, nausea and vomiting.  Musculoskeletal: Negative.  Negative for myalgias.  Neurological: Negative.  Negative for dizziness, focal weakness, seizures and headaches.  Psychiatric/Behavioral: Negative.  Negative for suicidal ideas.     Past Medical History:  Diagnosis Date   Allergy    sesonal    Asthma    as a child     Past Surgical History:  Procedure Laterality Date   APPENDECTOMY  2014   LAPAROSCOPIC APPENDECTOMY N/A 08/11/2013   Procedure: APPENDECTOMY  LAPAROSCOPIC;  Surgeon: Alm VEAR Angle, MD;  Location: [facility];  Service: General;  Laterality: N/A;    Family History  Problem Relation Age of Onset   Asthma Mother     Social History Reviewed with no changes to be made today.   Outpatient Medications Prior to Visit  Medication Sig Dispense Refill   albuterol   (PROVENTIL ) (2.5 MG/3ML) 0.083% nebulizer solution Take 6 mLs (2 vials) (5 mg total) by nebulization every 4 (four) hours as needed for wheezing or shortness of breath. 90 mL 3   atorvastatin  (LIPITOR) 40 MG tablet Take 1 tablet (40 mg total) by mouth daily. 90 tablet 3   albuterol  (VENTOLIN  HFA) 108 (90 Base) MCG/ACT inhaler INHALE 2 PUFFS INTO THE LUNGS EVERY 6 HOURS AS NEEDED FOR WHEEZING OR SHORTNESS OF BREATH 6.7 g 2   cetirizine  (ZYRTEC ) 10 MG tablet Take 1 tablet (10 mg total) by mouth daily. Please schedule follow-up appointment with Shawnda Mauney. 30 tablet 0   fluticasone  (FLONASE ) 50 MCG/ACT nasal spray Place 2 sprays into both nostrils daily. 16 g 1   montelukast  (SINGULAIR ) 10 MG tablet Take 1 tablet (10 mg total) by mouth at bedtime. Please schedule follow-up appointment with Uniqua Kihn. 30 tablet 0   azelastine  (ASTELIN ) 0.1 % nasal spray Place 2 sprays into both nostrils 2 (two) times daily. Use in each nostril as directed (Patient not taking: Reported on 07/31/2024) 30 mL 0   fluticasone  furoate-vilanterol (BREO ELLIPTA ) 100-25 MCG/ACT AEPB Inhale 1 puff into the lungs daily. (Patient not taking: Reported on 07/31/2024) 60 each 0   No facility-administered medications prior to visit.    Allergies  Allergen Reactions   Penicillins Itching       Objective:    BP 133/76 (BP Location: Left Arm, Patient Position: Sitting, Cuff Size: Normal)   Pulse 76   Resp 19   Ht 5' 9 (1.753 m)   Wt 278 lb 3.2 oz (126.2 kg)   SpO2 94%   BMI 41.08 kg/m  Wt Readings from Last 3 Encounters:  07/31/24 278 lb 3.2 oz (126.2 kg)  08/15/23 269 lb 3.2 oz (122.1 kg)  02/13/23 273 lb (123.8 kg)    Physical Exam Vitals and nursing note reviewed.  Constitutional:      Appearance: He is well-developed.  HENT:     Head: Normocephalic and atraumatic.     Mouth/Throat:     Pharynx: Pharyngeal swelling and posterior oropharyngeal erythema present.     Tonsils: No tonsillar exudate or tonsillar abscesses.  1+ on the right. 1+ on the left.  Neck:     Comments: Severe submental adiposity Cardiovascular:     Rate and Rhythm: Normal rate and regular rhythm.     Heart sounds: Normal heart sounds. No murmur heard.    No friction rub. No gallop.  Pulmonary:     Effort: Pulmonary effort is normal. No tachypnea or respiratory distress.     Breath sounds: Wheezing present. No decreased breath sounds, rhonchi or rales.  Chest:     Chest wall: No tenderness.  Abdominal:     General: Bowel sounds are normal.     Palpations: Abdomen is soft.  Musculoskeletal:        General: Normal range of motion.     Cervical back: Normal range of motion.  Skin:    General: Skin is warm and dry.  Neurological:     Mental Status: He is alert and oriented to person, place, and time.  Coordination: Coordination normal.  Psychiatric:        Behavior: Behavior normal. Behavior is cooperative.        Thought Content: Thought content normal.        Judgment: Judgment normal.          Patient has been counseled extensively about nutrition and exercise as well as the importance of adherence with medications and regular follow-up. The patient was given clear instructions to go to ER or return to medical center if symptoms don't improve, worsen or new problems develop. The patient verbalized understanding.   Follow-up: Return in about 6 months (around 01/28/2025).   Haze LELON Servant, FNP-BC [facility] and [facility] [place], [place] [phone]   07/31/2024, 9:57 AM

## 2024-08-01 LAB — CBC WITH DIFFERENTIAL/PLATELET
Basophils Absolute: 0.1 x10E3/uL (ref 0.0–0.2)
Basos: 1 %
EOS (ABSOLUTE): 0.6 x10E3/uL — ABNORMAL HIGH (ref 0.0–0.4)
Eos: 7 %
Hematocrit: 48 % (ref 37.5–51.0)
Hemoglobin: 15.4 g/dL (ref 13.0–17.7)
Immature Grans (Abs): 0 x10E3/uL (ref 0.0–0.1)
Immature Granulocytes: 0 %
Lymphocytes Absolute: 1.9 x10E3/uL (ref 0.7–3.1)
Lymphs: 24 %
MCH: 27.3 pg (ref 26.6–33.0)
MCHC: 32.1 g/dL (ref 31.5–35.7)
MCV: 85 fL (ref 79–97)
Monocytes Absolute: 0.7 x10E3/uL (ref 0.1–0.9)
Monocytes: 8 %
Neutrophils Absolute: 5 x10E3/uL (ref 1.4–7.0)
Neutrophils: 59 %
Platelets: 255 x10E3/uL (ref 150–450)
RBC: 5.65 x10E6/uL (ref 4.14–5.80)
RDW: 13.2 % (ref 11.6–15.4)
WBC: 8.2 x10E3/uL (ref 3.4–10.8)

## 2024-08-01 LAB — CMP14+EGFR
ALT: 9 IU/L (ref 0–44)
AST: 20 IU/L (ref 0–40)
Albumin: 4.7 g/dL (ref 4.1–5.1)
Alkaline Phosphatase: 95 IU/L (ref 47–123)
BUN/Creatinine Ratio: 17 (ref 9–20)
BUN: 13 mg/dL (ref 6–24)
Bilirubin Total: 0.5 mg/dL (ref 0.0–1.2)
CO2: 28 mmol/L (ref 20–29)
Calcium: 9.2 mg/dL (ref 8.7–10.2)
Chloride: 99 mmol/L (ref 96–106)
Creatinine, Ser: 0.78 mg/dL (ref 0.76–1.27)
Globulin, Total: 1.9 g/dL (ref 1.5–4.5)
Glucose: 94 mg/dL (ref 70–99)
Potassium: 4.4 mmol/L (ref 3.5–5.2)
Sodium: 142 mmol/L (ref 134–144)
Total Protein: 6.6 g/dL (ref 6.0–8.5)
eGFR: 116 mL/min/1.73 (ref >59)

## 2024-08-01 LAB — HEMOGLOBIN A1C
Est. average glucose Bld gHb Est-mCnc: 143 mg/dL
Hgb A1c MFr Bld: 6.6 % — ABNORMAL HIGH (ref 4.8–5.6)

## 2024-08-04 ENCOUNTER — Ambulatory Visit: Payer: Self-pay | Admitting: Nurse Practitioner

## 2024-09-17 ENCOUNTER — Other Ambulatory Visit: Payer: Self-pay

## 2024-09-23 ENCOUNTER — Other Ambulatory Visit: Payer: Self-pay

## 2024-10-07 ENCOUNTER — Other Ambulatory Visit: Payer: Self-pay

## 2025-01-28 ENCOUNTER — Ambulatory Visit: Payer: Self-pay | Admitting: Nurse Practitioner
